# Patient Record
Sex: Female | Born: 1960
Health system: Southern US, Community
[De-identification: ages and names within clinical notes are randomized; demographics above are authoritative.]

## PROBLEM LIST (undated history)

## (undated) DIAGNOSIS — E785 Hyperlipidemia, unspecified: Secondary | ICD-10-CM

## (undated) DIAGNOSIS — E039 Hypothyroidism, unspecified: Secondary | ICD-10-CM

## (undated) DIAGNOSIS — K219 Gastro-esophageal reflux disease without esophagitis: Secondary | ICD-10-CM

## (undated) DIAGNOSIS — E559 Vitamin D deficiency, unspecified: Secondary | ICD-10-CM

## (undated) DIAGNOSIS — M1990 Unspecified osteoarthritis, unspecified site: Secondary | ICD-10-CM

## (undated) HISTORY — PX: COLONOSCOPY: SHX174

## (undated) HISTORY — PX: TUBAL LIGATION: SHX77

## (undated) HISTORY — DX: Vitamin D deficiency, unspecified: E55.9

---

## 1983-07-03 HISTORY — PX: TUBAL LIGATION: SHX77

## 2005-07-11 ENCOUNTER — Encounter: Admission: RE | Admit: 2005-07-11 | Discharge: 2005-07-11 | Payer: Self-pay | Admitting: General Surgery

## 2006-07-22 ENCOUNTER — Encounter: Admission: RE | Admit: 2006-07-22 | Discharge: 2006-07-22 | Payer: Self-pay | Admitting: Family Medicine

## 2016-12-19 DIAGNOSIS — R310 Gross hematuria: Secondary | ICD-10-CM | POA: Diagnosis not present

## 2016-12-19 DIAGNOSIS — R319 Hematuria, unspecified: Secondary | ICD-10-CM | POA: Diagnosis not present

## 2016-12-27 DIAGNOSIS — R319 Hematuria, unspecified: Secondary | ICD-10-CM | POA: Diagnosis not present

## 2017-03-01 DIAGNOSIS — Z01419 Encounter for gynecological examination (general) (routine) without abnormal findings: Secondary | ICD-10-CM | POA: Diagnosis not present

## 2017-03-01 DIAGNOSIS — Z6826 Body mass index (BMI) 26.0-26.9, adult: Secondary | ICD-10-CM | POA: Diagnosis not present

## 2017-03-11 DIAGNOSIS — Z1231 Encounter for screening mammogram for malignant neoplasm of breast: Secondary | ICD-10-CM | POA: Diagnosis not present

## 2017-03-11 DIAGNOSIS — Z13820 Encounter for screening for osteoporosis: Secondary | ICD-10-CM | POA: Diagnosis not present

## 2017-05-01 DIAGNOSIS — M8580 Other specified disorders of bone density and structure, unspecified site: Secondary | ICD-10-CM | POA: Diagnosis not present

## 2017-08-07 DIAGNOSIS — E559 Vitamin D deficiency, unspecified: Secondary | ICD-10-CM | POA: Diagnosis not present

## 2017-08-07 DIAGNOSIS — R799 Abnormal finding of blood chemistry, unspecified: Secondary | ICD-10-CM | POA: Diagnosis not present

## 2017-09-19 DIAGNOSIS — R946 Abnormal results of thyroid function studies: Secondary | ICD-10-CM | POA: Diagnosis not present

## 2017-11-12 DIAGNOSIS — E559 Vitamin D deficiency, unspecified: Secondary | ICD-10-CM | POA: Diagnosis not present

## 2017-12-03 DIAGNOSIS — R946 Abnormal results of thyroid function studies: Secondary | ICD-10-CM | POA: Diagnosis not present

## 2017-12-17 DIAGNOSIS — L578 Other skin changes due to chronic exposure to nonionizing radiation: Secondary | ICD-10-CM | POA: Diagnosis not present

## 2017-12-17 DIAGNOSIS — L821 Other seborrheic keratosis: Secondary | ICD-10-CM | POA: Diagnosis not present

## 2017-12-17 DIAGNOSIS — R58 Hemorrhage, not elsewhere classified: Secondary | ICD-10-CM | POA: Diagnosis not present

## 2017-12-17 DIAGNOSIS — L820 Inflamed seborrheic keratosis: Secondary | ICD-10-CM | POA: Diagnosis not present

## 2018-02-11 DIAGNOSIS — E559 Vitamin D deficiency, unspecified: Secondary | ICD-10-CM | POA: Diagnosis not present

## 2018-06-24 DIAGNOSIS — E559 Vitamin D deficiency, unspecified: Secondary | ICD-10-CM | POA: Diagnosis not present

## 2018-06-24 DIAGNOSIS — Z01419 Encounter for gynecological examination (general) (routine) without abnormal findings: Secondary | ICD-10-CM | POA: Diagnosis not present

## 2018-06-24 DIAGNOSIS — Z1231 Encounter for screening mammogram for malignant neoplasm of breast: Secondary | ICD-10-CM | POA: Diagnosis not present

## 2018-06-24 DIAGNOSIS — Z6826 Body mass index (BMI) 26.0-26.9, adult: Secondary | ICD-10-CM | POA: Diagnosis not present

## 2018-06-24 DIAGNOSIS — E039 Hypothyroidism, unspecified: Secondary | ICD-10-CM | POA: Diagnosis not present

## 2018-07-15 DIAGNOSIS — M25579 Pain in unspecified ankle and joints of unspecified foot: Secondary | ICD-10-CM | POA: Diagnosis not present

## 2018-07-15 DIAGNOSIS — M79671 Pain in right foot: Secondary | ICD-10-CM | POA: Diagnosis not present

## 2018-07-15 DIAGNOSIS — M25539 Pain in unspecified wrist: Secondary | ICD-10-CM | POA: Diagnosis not present

## 2018-07-15 DIAGNOSIS — M25572 Pain in left ankle and joints of left foot: Secondary | ICD-10-CM | POA: Diagnosis not present

## 2018-07-15 DIAGNOSIS — M25571 Pain in right ankle and joints of right foot: Secondary | ICD-10-CM | POA: Diagnosis not present

## 2018-07-15 DIAGNOSIS — M79672 Pain in left foot: Secondary | ICD-10-CM | POA: Diagnosis not present

## 2018-07-15 DIAGNOSIS — M064 Inflammatory polyarthropathy: Secondary | ICD-10-CM | POA: Diagnosis not present

## 2018-07-15 DIAGNOSIS — M79642 Pain in left hand: Secondary | ICD-10-CM | POA: Diagnosis not present

## 2018-07-15 DIAGNOSIS — M79643 Pain in unspecified hand: Secondary | ICD-10-CM | POA: Diagnosis not present

## 2018-07-15 DIAGNOSIS — M79641 Pain in right hand: Secondary | ICD-10-CM | POA: Diagnosis not present

## 2018-07-23 DIAGNOSIS — M25539 Pain in unspecified wrist: Secondary | ICD-10-CM | POA: Diagnosis not present

## 2018-07-23 DIAGNOSIS — M79671 Pain in right foot: Secondary | ICD-10-CM | POA: Diagnosis not present

## 2018-07-23 DIAGNOSIS — M79643 Pain in unspecified hand: Secondary | ICD-10-CM | POA: Diagnosis not present

## 2018-07-23 DIAGNOSIS — M25579 Pain in unspecified ankle and joints of unspecified foot: Secondary | ICD-10-CM | POA: Diagnosis not present

## 2018-08-21 DIAGNOSIS — G2581 Restless legs syndrome: Secondary | ICD-10-CM | POA: Diagnosis not present

## 2018-08-21 DIAGNOSIS — G478 Other sleep disorders: Secondary | ICD-10-CM | POA: Diagnosis not present

## 2018-08-21 DIAGNOSIS — E063 Autoimmune thyroiditis: Secondary | ICD-10-CM | POA: Diagnosis not present

## 2018-08-21 DIAGNOSIS — G4700 Insomnia, unspecified: Secondary | ICD-10-CM | POA: Diagnosis not present

## 2018-08-21 DIAGNOSIS — E039 Hypothyroidism, unspecified: Secondary | ICD-10-CM | POA: Diagnosis not present

## 2018-08-21 DIAGNOSIS — E559 Vitamin D deficiency, unspecified: Secondary | ICD-10-CM | POA: Diagnosis not present

## 2018-08-21 DIAGNOSIS — M791 Myalgia, unspecified site: Secondary | ICD-10-CM | POA: Diagnosis not present

## 2018-08-28 DIAGNOSIS — E038 Other specified hypothyroidism: Secondary | ICD-10-CM | POA: Diagnosis not present

## 2018-08-28 DIAGNOSIS — M8588 Other specified disorders of bone density and structure, other site: Secondary | ICD-10-CM | POA: Diagnosis not present

## 2018-08-28 DIAGNOSIS — R197 Diarrhea, unspecified: Secondary | ICD-10-CM | POA: Diagnosis not present

## 2018-08-28 DIAGNOSIS — E063 Autoimmune thyroiditis: Secondary | ICD-10-CM | POA: Diagnosis not present

## 2018-08-28 DIAGNOSIS — E559 Vitamin D deficiency, unspecified: Secondary | ICD-10-CM | POA: Diagnosis not present

## 2019-01-29 DIAGNOSIS — E038 Other specified hypothyroidism: Secondary | ICD-10-CM | POA: Diagnosis not present

## 2019-01-29 DIAGNOSIS — E7800 Pure hypercholesterolemia, unspecified: Secondary | ICD-10-CM | POA: Diagnosis not present

## 2019-01-29 DIAGNOSIS — G2581 Restless legs syndrome: Secondary | ICD-10-CM | POA: Diagnosis not present

## 2019-01-29 DIAGNOSIS — Z79899 Other long term (current) drug therapy: Secondary | ICD-10-CM | POA: Diagnosis not present

## 2019-02-02 DIAGNOSIS — E038 Other specified hypothyroidism: Secondary | ICD-10-CM | POA: Diagnosis not present

## 2019-02-02 DIAGNOSIS — Z79899 Other long term (current) drug therapy: Secondary | ICD-10-CM | POA: Diagnosis not present

## 2019-02-02 DIAGNOSIS — E7800 Pure hypercholesterolemia, unspecified: Secondary | ICD-10-CM | POA: Diagnosis not present

## 2019-03-17 DIAGNOSIS — Z13820 Encounter for screening for osteoporosis: Secondary | ICD-10-CM | POA: Diagnosis not present

## 2019-07-22 DIAGNOSIS — Z1231 Encounter for screening mammogram for malignant neoplasm of breast: Secondary | ICD-10-CM | POA: Diagnosis not present

## 2019-07-22 DIAGNOSIS — Z01419 Encounter for gynecological examination (general) (routine) without abnormal findings: Secondary | ICD-10-CM | POA: Diagnosis not present

## 2019-07-22 DIAGNOSIS — Z6828 Body mass index (BMI) 28.0-28.9, adult: Secondary | ICD-10-CM | POA: Diagnosis not present

## 2019-07-22 DIAGNOSIS — E559 Vitamin D deficiency, unspecified: Secondary | ICD-10-CM | POA: Diagnosis not present

## 2019-08-31 ENCOUNTER — Observation Stay (HOSPITAL_COMMUNITY)
Admission: EM | Admit: 2019-08-31 | Discharge: 2019-09-01 | Disposition: A | Discharge status: Home / Self Care | Payer: BC Managed Care – PPO | Attending: Surgery | Admitting: Surgery

## 2019-08-31 ENCOUNTER — Encounter (HOSPITAL_COMMUNITY): Payer: Self-pay | Admitting: Emergency Medicine

## 2019-08-31 ENCOUNTER — Other Ambulatory Visit: Payer: Self-pay

## 2019-08-31 DIAGNOSIS — R1011 Right upper quadrant pain: Secondary | ICD-10-CM | POA: Diagnosis not present

## 2019-08-31 DIAGNOSIS — K810 Acute cholecystitis: Secondary | ICD-10-CM | POA: Diagnosis not present

## 2019-08-31 DIAGNOSIS — Z03818 Encounter for observation for suspected exposure to other biological agents ruled out: Secondary | ICD-10-CM | POA: Diagnosis not present

## 2019-08-31 DIAGNOSIS — Z20822 Contact with and (suspected) exposure to covid-19: Secondary | ICD-10-CM | POA: Diagnosis not present

## 2019-08-31 DIAGNOSIS — K8010 Calculus of gallbladder with chronic cholecystitis without obstruction: Principal | ICD-10-CM | POA: Insufficient documentation

## 2019-08-31 DIAGNOSIS — E039 Hypothyroidism, unspecified: Secondary | ICD-10-CM | POA: Diagnosis not present

## 2019-08-31 DIAGNOSIS — E785 Hyperlipidemia, unspecified: Secondary | ICD-10-CM | POA: Diagnosis not present

## 2019-08-31 DIAGNOSIS — K819 Cholecystitis, unspecified: Secondary | ICD-10-CM | POA: Diagnosis present

## 2019-08-31 HISTORY — DX: Hyperlipidemia, unspecified: E78.5

## 2019-08-31 HISTORY — DX: Gastro-esophageal reflux disease without esophagitis: K21.9

## 2019-08-31 HISTORY — DX: Unspecified osteoarthritis, unspecified site: M19.90

## 2019-08-31 HISTORY — DX: Hypothyroidism, unspecified: E03.9

## 2019-08-31 LAB — COMPREHENSIVE METABOLIC PANEL
ALT: 30 U/L (ref 0–44)
AST: 25 U/L (ref 15–41)
Albumin: 4.4 g/dL (ref 3.5–5.0)
Alkaline Phosphatase: 68 U/L (ref 38–126)
Anion gap: 12 (ref 5–15)
BUN: 13 mg/dL (ref 6–20)
CO2: 23 mmol/L (ref 22–32)
Calcium: 9.4 mg/dL (ref 8.9–10.3)
Chloride: 104 mmol/L (ref 98–111)
Creatinine, Ser: 0.77 mg/dL (ref 0.44–1.00)
GFR calc Af Amer: >60 mL/min (ref >60)
GFR calc non Af Amer: >60 mL/min (ref >60)
Glucose, Bld: 105 mg/dL — ABNORMAL HIGH (ref 70–99)
Potassium: 3.7 mmol/L (ref 3.5–5.1)
Sodium: 139 mmol/L (ref 135–145)
Total Bilirubin: 0.7 mg/dL (ref 0.3–1.2)
Total Protein: 7.6 g/dL (ref 6.5–8.1)

## 2019-08-31 LAB — CBC
HCT: 42.3 % (ref 36.0–46.0)
Hemoglobin: 13.8 g/dL (ref 12.0–15.0)
MCH: 29.1 pg (ref 26.0–34.0)
MCHC: 32.6 g/dL (ref 30.0–36.0)
MCV: 89.1 fL (ref 80.0–100.0)
Platelets: 291 10*3/uL (ref 150–400)
RBC: 4.75 MIL/uL (ref 3.87–5.11)
RDW: 12 % (ref 11.5–15.5)
WBC: 8.4 10*3/uL (ref 4.0–10.5)
nRBC: 0 % (ref 0.0–0.2)

## 2019-08-31 LAB — URINALYSIS, ROUTINE W REFLEX MICROSCOPIC
Bacteria, UA: NONE SEEN
Bilirubin Urine: NEGATIVE
Glucose, UA: NEGATIVE mg/dL
Ketones, ur: NEGATIVE mg/dL
Nitrite: NEGATIVE
Protein, ur: NEGATIVE mg/dL
Specific Gravity, Urine: 1.021 (ref 1.005–1.030)
pH: 6 (ref 5.0–8.0)

## 2019-08-31 LAB — LIPASE, BLOOD: Lipase: 25 U/L (ref 11–51)

## 2019-08-31 MED ORDER — SODIUM CHLORIDE 0.9% FLUSH: 3.0000 mL | Freq: Once | INTRAVENOUS | Status: DC

## 2019-08-31 MED ORDER — ONDANSETRON HCL 4 MG/2ML IJ SOLN
4.0000 mg | Freq: Once | INTRAMUSCULAR | Status: AC
  Administered 2019-09-01: 4 mg via INTRAVENOUS
  Filled 2019-08-31: qty 2

## 2019-08-31 MED ORDER — HYDROMORPHONE HCL 1 MG/ML IJ SOLN
0.5000 mg | Freq: Once | INTRAMUSCULAR | Status: AC
  Administered 2019-09-01: 0.5 mg via INTRAVENOUS
  Filled 2019-08-31: qty 1

## 2019-08-31 NOTE — ED Triage Notes (Signed)
Pt arrives to ER after a sudden onset of RUQ pain that began right after eating at 12pm. Pt states she has been told she has stones in the past but never had this type of pain.

## 2019-08-31 NOTE — ED Provider Notes (Signed)
[facility] EMERGENCY DEPARTMENT Provider Note   CSN: [id number] Arrival date & time: 08/31/19  1600     History Chief Complaint  Patient presents with  . Abdominal Pain    Joyce Allison is a 59 y.o. female.  Patient with history of HLD, thyroid disease presents with RUQ abdominal pain that started around noon today after eating lunch which included meatloaf and mashed potatoes. She has had nausea and pain throughout the day with infrequent vomiting. She reports multiple loose, nonbloody stools today as well. No fever, chest pain, SOB or cough. No urinary symptoms. She states that taking a deep breath makes her abdominal pain worse. She has not been able to eat or drink anything else today as any attempt has exacerbated her pain.  The history is provided by the patient. No language interpreter was used.  Abdominal Pain Associated symptoms: diarrhea, nausea and vomiting   Associated symptoms: no chills and no fever        History reviewed. No pertinent past medical history.  There are no problems to display for this patient.  OB History   No obstetric history on file.     No family history on file.  Social History   Tobacco Use  . Smoking status: Not on file  Substance Use Topics  . Alcohol use: Not on file  . Drug use: Not on file    Home Medications Prior to Admission medications   Not on File    Allergies    Motrin [ibuprofen]  Review of Systems   Review of Systems  Constitutional: Negative for chills and fever.  HENT: Negative.   Respiratory: Negative.   Cardiovascular: Negative.   Gastrointestinal: Positive for abdominal pain, diarrhea, nausea and vomiting. Negative for blood in stool.  Musculoskeletal: Negative.  Negative for back pain.  Skin: Negative.   Neurological: Negative.     Physical Exam Updated Vital Signs BP (!) 146/88 (BP Location: Left Arm)   Pulse 81   Temp 98.1 F (36.7 C) (Oral)   Resp 16   SpO2 98%    Physical Exam Vitals and nursing note reviewed.  Constitutional:      Appearance: She is well-developed. She is obese.  HENT:     Head: Normocephalic.  Cardiovascular:     Rate and Rhythm: Normal rate and regular rhythm.  Pulmonary:     Effort: Pulmonary effort is normal.     Breath sounds: Normal breath sounds.  Abdominal:     General: Bowel sounds are decreased.     Palpations: Abdomen is soft.     Tenderness: There is abdominal tenderness in the right upper quadrant and epigastric area. There is guarding. There is no rebound.  Musculoskeletal:        General: Normal range of motion.     Cervical back: Normal range of motion and neck supple.  Skin:    General: Skin is warm and dry.     Findings: No rash.  Neurological:     Mental Status: She is alert.     Cranial Nerves: No cranial nerve deficit.     ED Results / Procedures / Treatments   Labs (all labs ordered are listed, but only abnormal results are displayed) Labs Reviewed  COMPREHENSIVE METABOLIC PANEL - Abnormal; Notable for the following components:      Result Value   Glucose, Bld 105 (*)    All other components within normal limits  URINALYSIS, ROUTINE W REFLEX MICROSCOPIC - Abnormal; Notable  for the following components:   Hgb urine dipstick SMALL (*)    Leukocytes,Ua MODERATE (*)    All other components within normal limits  LIPASE, BLOOD  CBC    EKG None  Radiology No results found.  Procedures Procedures (including critical care time)  Medications Ordered in ED Medications  sodium chloride flush (NS) 0.9 % injection 3 mL (has no administration in time range)  HYDROmorphone (DILAUDID) injection 0.5 mg (has no administration in time range)  ondansetron (ZOFRAN) injection 4 mg (has no administration in time range)    ED Course  I have reviewed the triage vital signs and the nursing notes.  Pertinent labs & imaging results that were available during my care of the patient were reviewed by  me and considered in my medical decision making (see chart for details).    MDM Rules/Calculators/A&P                      Patient to ED with abdominal pain in the RUQ since earlier today.   She is uncomfortable appearing but nontoxic. Tender RUQ, concerning for cholecystitis/cholelithiasis. Pain and nausea medication ordered.   On re-evaluation, pain is improved, nausea resolved. [place] pending. Labs essentially unremarkable.   1:30 - US findings c/w acute cholecystitis. On re-eval, she remain tender in the RUQ. Consult surgery.   Discussed patient's condition with Dr. Derrell Lolling who will see the patient in anticipation of acute cholecystectomy. COVID collected. Rocephin ordered per Dr. Jacinto Halim recommendation. The patient has been updated on plan of admission and surgery.  Final Clinical Impression(s) / ED Diagnoses Final diagnoses:  None   1. Acute cholecystitis  Rx / DC Orders ED Discharge Orders    None       Danne Harbor 09/01/19 0209    Palumbo, April, MD 09/01/19 0272

## 2019-09-01 ENCOUNTER — Emergency Department (HOSPITAL_COMMUNITY): Payer: BC Managed Care – PPO

## 2019-09-01 ENCOUNTER — Encounter (HOSPITAL_COMMUNITY): Payer: Self-pay

## 2019-09-01 ENCOUNTER — Other Ambulatory Visit: Payer: Self-pay

## 2019-09-01 ENCOUNTER — Observation Stay (HOSPITAL_COMMUNITY): Payer: BC Managed Care – PPO | Admitting: Certified Registered Nurse Anesthetist

## 2019-09-01 ENCOUNTER — Encounter (HOSPITAL_COMMUNITY)
Admission: EM | Disposition: A | Discharge status: Home / Self Care | Payer: Self-pay | Source: Home / Self Care | Attending: Emergency Medicine

## 2019-09-01 DIAGNOSIS — E039 Hypothyroidism, unspecified: Secondary | ICD-10-CM | POA: Diagnosis not present

## 2019-09-01 DIAGNOSIS — E785 Hyperlipidemia, unspecified: Secondary | ICD-10-CM | POA: Diagnosis not present

## 2019-09-01 DIAGNOSIS — K819 Cholecystitis, unspecified: Secondary | ICD-10-CM | POA: Diagnosis present

## 2019-09-01 DIAGNOSIS — K810 Acute cholecystitis: Secondary | ICD-10-CM | POA: Diagnosis not present

## 2019-09-01 DIAGNOSIS — K8020 Calculus of gallbladder without cholecystitis without obstruction: Secondary | ICD-10-CM | POA: Diagnosis not present

## 2019-09-01 DIAGNOSIS — K8010 Calculus of gallbladder with chronic cholecystitis without obstruction: Secondary | ICD-10-CM | POA: Diagnosis not present

## 2019-09-01 DIAGNOSIS — K219 Gastro-esophageal reflux disease without esophagitis: Secondary | ICD-10-CM | POA: Diagnosis not present

## 2019-09-01 HISTORY — PX: CHOLECYSTECTOMY: SHX55

## 2019-09-01 LAB — RESPIRATORY PANEL BY RT PCR (FLU A&B, COVID)
Influenza A by PCR: NEGATIVE
Influenza B by PCR: NEGATIVE
SARS Coronavirus 2 by RT PCR: NEGATIVE

## 2019-09-01 LAB — SURGICAL PCR SCREEN
MRSA, PCR: NEGATIVE
Staphylococcus aureus: POSITIVE — AB

## 2019-09-01 LAB — HIV ANTIBODY (ROUTINE TESTING W REFLEX): HIV Screen 4th Generation wRfx: NONREACTIVE

## 2019-09-01 SURGERY — LAPAROSCOPIC CHOLECYSTECTOMY WITH INTRAOPERATIVE CHOLANGIOGRAM
Anesthesia: General | Site: Abdomen

## 2019-09-01 MED ORDER — GLYCOPYRROLATE PF 0.2 MG/ML IJ SOSY
PREFILLED_SYRINGE | INTRAMUSCULAR | Status: DC | PRN
  Administered 2019-09-01: .2 mg via INTRAVENOUS

## 2019-09-01 MED ORDER — DIPHENHYDRAMINE HCL 12.5 MG/5ML PO ELIX: 12.5000 mg | ORAL_SOLUTION | Freq: Four times a day (QID) | ORAL | Status: DC | PRN

## 2019-09-01 MED ORDER — BUPIVACAINE HCL (PF) 0.25 % IJ SOLN
INTRAMUSCULAR | Status: AC
  Filled 2019-09-01: qty 30

## 2019-09-01 MED ORDER — POTASSIUM CHLORIDE 2 MEQ/ML IV SOLN
INTRAVENOUS | Status: DC
  Filled 2019-09-01 (×3): qty 1000

## 2019-09-01 MED ORDER — KCL IN DEXTROSE-NACL 40-5-0.9 MEQ/L-%-% IV SOLN
INTRAVENOUS | Status: DC
  Filled 2019-09-01: qty 1000

## 2019-09-01 MED ORDER — ENOXAPARIN SODIUM 40 MG/0.4ML ~~LOC~~ SOLN: 40.0000 mg | SUBCUTANEOUS | Status: DC

## 2019-09-01 MED ORDER — HYDROMORPHONE HCL 1 MG/ML IJ SOLN: 1.0000 mg | INTRAMUSCULAR | Status: DC | PRN

## 2019-09-01 MED ORDER — ACETAMINOPHEN 325 MG PO TABS: 325.0000 mg | ORAL_TABLET | Freq: Once | ORAL | Status: DC | PRN

## 2019-09-01 MED ORDER — ACETAMINOPHEN 10 MG/ML IV SOLN: 1000.0000 mg | Freq: Once | INTRAVENOUS | Status: DC | PRN

## 2019-09-01 MED ORDER — MEPERIDINE HCL 25 MG/ML IJ SOLN: 6.2500 mg | INTRAMUSCULAR | Status: DC | PRN

## 2019-09-01 MED ORDER — MIDAZOLAM HCL 2 MG/2ML IJ SOLN
INTRAMUSCULAR | Status: AC
  Filled 2019-09-01: qty 2

## 2019-09-01 MED ORDER — SODIUM CHLORIDE 0.9 % IR SOLN
Status: DC | PRN
  Administered 2019-09-01: 1

## 2019-09-01 MED ORDER — MIDAZOLAM HCL 2 MG/2ML IJ SOLN
INTRAMUSCULAR | Status: DC | PRN
  Administered 2019-09-01: 2 mg via INTRAVENOUS

## 2019-09-01 MED ORDER — ROCURONIUM BROMIDE 10 MG/ML (PF) SYRINGE
PREFILLED_SYRINGE | INTRAVENOUS | Status: DC | PRN
  Administered 2019-09-01: 30 mg via INTRAVENOUS

## 2019-09-01 MED ORDER — GLYCOPYRROLATE PF 0.2 MG/ML IJ SOSY
PREFILLED_SYRINGE | INTRAMUSCULAR | Status: AC
  Filled 2019-09-01: qty 1

## 2019-09-01 MED ORDER — SUCCINYLCHOLINE CHLORIDE 200 MG/10ML IV SOSY
PREFILLED_SYRINGE | INTRAVENOUS | Status: AC
  Filled 2019-09-01: qty 10

## 2019-09-01 MED ORDER — ONDANSETRON HCL 4 MG/2ML IJ SOLN
INTRAMUSCULAR | Status: DC | PRN
  Administered 2019-09-01: 4 mg via INTRAVENOUS

## 2019-09-01 MED ORDER — ONDANSETRON 4 MG PO TBDP: 4.0000 mg | ORAL_TABLET | Freq: Four times a day (QID) | ORAL | Status: DC | PRN

## 2019-09-01 MED ORDER — SUGAMMADEX SODIUM 200 MG/2ML IV SOLN
INTRAVENOUS | Status: DC | PRN
  Administered 2019-09-01: 200 mg via INTRAVENOUS

## 2019-09-01 MED ORDER — BUPIVACAINE HCL 0.25 % IJ SOLN
INTRAMUSCULAR | Status: DC | PRN
  Administered 2019-09-01: 25 mL

## 2019-09-01 MED ORDER — ROCURONIUM BROMIDE 10 MG/ML (PF) SYRINGE
PREFILLED_SYRINGE | INTRAVENOUS | Status: AC
  Filled 2019-09-01: qty 10

## 2019-09-01 MED ORDER — SODIUM CHLORIDE 0.9 % IV SOLN
1.0000 g | Freq: Once | INTRAVENOUS | Status: AC
  Administered 2019-09-01: 1 g via INTRAVENOUS
  Filled 2019-09-01: qty 10

## 2019-09-01 MED ORDER — DEXAMETHASONE SODIUM PHOSPHATE 10 MG/ML IJ SOLN
INTRAMUSCULAR | Status: DC | PRN
  Administered 2019-09-01: 5 mg via INTRAVENOUS

## 2019-09-01 MED ORDER — PHENYLEPHRINE 40 MCG/ML (10ML) SYRINGE FOR IV PUSH (FOR BLOOD PRESSURE SUPPORT)
PREFILLED_SYRINGE | INTRAVENOUS | Status: DC | PRN
  Administered 2019-09-01: 80 ug via INTRAVENOUS

## 2019-09-01 MED ORDER — LACTATED RINGERS IV SOLN: INTRAVENOUS | Status: DC

## 2019-09-01 MED ORDER — ONDANSETRON HCL 4 MG/2ML IJ SOLN
INTRAMUSCULAR | Status: AC
  Filled 2019-09-01: qty 2

## 2019-09-01 MED ORDER — ALUM & MAG HYDROXIDE-SIMETH 200-200-20 MG/5ML PO SUSP: 30.0000 mL | Freq: Four times a day (QID) | ORAL | Status: DC | PRN

## 2019-09-01 MED ORDER — PROPOFOL 10 MG/ML IV BOLUS
INTRAVENOUS | Status: DC | PRN
  Administered 2019-09-01: 140 mg via INTRAVENOUS

## 2019-09-01 MED ORDER — ACETAMINOPHEN 325 MG PO TABS: 650.0000 mg | ORAL_TABLET | Freq: Four times a day (QID) | ORAL | Status: DC | PRN

## 2019-09-01 MED ORDER — ACETAMINOPHEN 160 MG/5ML PO SOLN: 325.0000 mg | Freq: Once | ORAL | Status: DC | PRN

## 2019-09-01 MED ORDER — DIPHENHYDRAMINE HCL 50 MG/ML IJ SOLN
INTRAMUSCULAR | Status: AC
  Filled 2019-09-01: qty 1

## 2019-09-01 MED ORDER — LACTATED RINGERS IV SOLN: INTRAVENOUS | Status: DC | PRN

## 2019-09-01 MED ORDER — ENSURE SURGERY PO LIQD
237.0000 mL | Freq: Two times a day (BID) | ORAL | Status: DC
  Administered 2019-09-01: 237 mL via ORAL
  Filled 2019-09-01 (×3): qty 237

## 2019-09-01 MED ORDER — 0.9 % SODIUM CHLORIDE (POUR BTL) OPTIME
TOPICAL | Status: DC | PRN
  Administered 2019-09-01: 1000 mL

## 2019-09-01 MED ORDER — FENTANYL CITRATE (PF) 250 MCG/5ML IJ SOLN
INTRAMUSCULAR | Status: AC
  Filled 2019-09-01: qty 5

## 2019-09-01 MED ORDER — OXYCODONE HCL 5 MG PO TABS: 5.0000 mg | ORAL_TABLET | ORAL | Status: DC | PRN

## 2019-09-01 MED ORDER — LIDOCAINE 2% (20 MG/ML) 5 ML SYRINGE
INTRAMUSCULAR | Status: DC | PRN
  Administered 2019-09-01: 60 mg via INTRAVENOUS

## 2019-09-01 MED ORDER — FENTANYL CITRATE (PF) 250 MCG/5ML IJ SOLN
INTRAMUSCULAR | Status: DC | PRN
  Administered 2019-09-01 (×2): 50 ug via INTRAVENOUS
  Administered 2019-09-01: 100 ug via INTRAVENOUS

## 2019-09-01 MED ORDER — PROMETHAZINE HCL 25 MG/ML IJ SOLN: 6.2500 mg | INTRAMUSCULAR | Status: DC | PRN

## 2019-09-01 MED ORDER — DEXAMETHASONE SODIUM PHOSPHATE 10 MG/ML IJ SOLN
INTRAMUSCULAR | Status: AC
  Filled 2019-09-01: qty 1

## 2019-09-01 MED ORDER — ACETAMINOPHEN 325 MG PO TABS
650.0000 mg | ORAL_TABLET | Freq: Four times a day (QID) | ORAL | Status: DC
  Administered 2019-09-01: 12:00:00 650 mg via ORAL
  Filled 2019-09-01 (×2): qty 2

## 2019-09-01 MED ORDER — PHENYLEPHRINE 40 MCG/ML (10ML) SYRINGE FOR IV PUSH (FOR BLOOD PRESSURE SUPPORT)
PREFILLED_SYRINGE | INTRAVENOUS | Status: AC
  Filled 2019-09-01: qty 10

## 2019-09-01 MED ORDER — LIDOCAINE 2% (20 MG/ML) 5 ML SYRINGE
INTRAMUSCULAR | Status: AC
  Filled 2019-09-01: qty 5

## 2019-09-01 MED ORDER — ATORVASTATIN CALCIUM 10 MG PO TABS: 20.0000 mg | ORAL_TABLET | Freq: Every day | ORAL | Status: DC

## 2019-09-01 MED ORDER — PROPOFOL 10 MG/ML IV BOLUS
INTRAVENOUS | Status: AC
  Filled 2019-09-01: qty 20

## 2019-09-01 MED ORDER — IBUPROFEN 800 MG PO TABS: 800.0000 mg | ORAL_TABLET | Freq: Three times a day (TID) | ORAL | 0 refills | Status: DC | PRN

## 2019-09-01 MED ORDER — SUCCINYLCHOLINE CHLORIDE 200 MG/10ML IV SOSY
PREFILLED_SYRINGE | INTRAVENOUS | Status: DC | PRN
  Administered 2019-09-01: 140 mg via INTRAVENOUS

## 2019-09-01 MED ORDER — VITAMIN D 25 MCG (1000 UNIT) PO TABS
1000.0000 [IU] | ORAL_TABLET | Freq: Every day | ORAL | Status: DC
  Administered 2019-09-01: 12:00:00 1000 [IU] via ORAL
  Filled 2019-09-01: qty 1

## 2019-09-01 MED ORDER — ONDANSETRON HCL 4 MG/2ML IJ SOLN: 4.0000 mg | Freq: Four times a day (QID) | INTRAMUSCULAR | Status: DC | PRN

## 2019-09-01 MED ORDER — HYDROMORPHONE HCL 1 MG/ML IJ SOLN
0.2500 mg | INTRAMUSCULAR | Status: DC | PRN
  Administered 2019-09-01 (×2): 0.5 mg via INTRAVENOUS

## 2019-09-01 MED ORDER — ESMOLOL HCL 100 MG/10ML IV SOLN
INTRAVENOUS | Status: AC
  Filled 2019-09-01: qty 10

## 2019-09-01 MED ORDER — LEVOTHYROXINE SODIUM 75 MCG PO TABS: 75.0000 ug | ORAL_TABLET | Freq: Every morning | ORAL | Status: DC

## 2019-09-01 MED ORDER — DIPHENHYDRAMINE HCL 50 MG/ML IJ SOLN
INTRAMUSCULAR | Status: DC | PRN
  Administered 2019-09-01: 12.5 mg via INTRAVENOUS

## 2019-09-01 MED ORDER — HYDROMORPHONE HCL 1 MG/ML IJ SOLN
INTRAMUSCULAR | Status: AC
  Filled 2019-09-01: qty 1

## 2019-09-01 MED ORDER — DIPHENHYDRAMINE HCL 50 MG/ML IJ SOLN: 12.5000 mg | Freq: Four times a day (QID) | INTRAMUSCULAR | Status: DC | PRN

## 2019-09-01 MED ORDER — ESMOLOL HCL 100 MG/10ML IV SOLN
INTRAVENOUS | Status: DC | PRN
  Administered 2019-09-01 (×2): 20 mg via INTRAVENOUS

## 2019-09-01 MED ORDER — OXYCODONE HCL 5 MG PO TABS: 5.0000 mg | ORAL_TABLET | Freq: Four times a day (QID) | ORAL | 0 refills | Status: DC | PRN

## 2019-09-01 SURGICAL SUPPLY — 38 items
APPLIER CLIP ROT 10 11.4 M/L (STAPLE) ×3
BLADE CLIPPER SURG (BLADE) IMPLANT
CANISTER SUCT 3000ML PPV (MISCELLANEOUS) ×3 IMPLANT
CHLORAPREP W/TINT 26 (MISCELLANEOUS) ×3 IMPLANT
CLIP APPLIE ROT 10 11.4 M/L (STAPLE) ×1 IMPLANT
COVER MAYO STAND STRL (DRAPES) ×3 IMPLANT
COVER SURGICAL LIGHT HANDLE (MISCELLANEOUS) ×3 IMPLANT
DERMABOND ADVANCED (GAUZE/BANDAGES/DRESSINGS) ×2
DERMABOND ADVANCED .7 DNX12 (GAUZE/BANDAGES/DRESSINGS) IMPLANT
DISSECTOR BLUNT TIP ENDO 5MM (MISCELLANEOUS) ×3 IMPLANT
DRAPE C-ARM 42X120 X-RAY (DRAPES) ×3 IMPLANT
ELECT REM PT RETURN 9FT ADLT (ELECTROSURGICAL) ×3
ELECTRODE REM PT RTRN 9FT ADLT (ELECTROSURGICAL) ×1 IMPLANT
GLOVE BIO SURGEON STRL SZ 6.5 (GLOVE) ×2 IMPLANT
GLOVE BIO SURGEONS STRL SZ 6.5 (GLOVE) ×1
GLOVE BIOGEL PI IND STRL 6 (GLOVE) ×1 IMPLANT
GLOVE BIOGEL PI INDICATOR 6 (GLOVE) ×2
GOWN STRL REUS W/ TWL LRG LVL3 (GOWN DISPOSABLE) ×3 IMPLANT
GOWN STRL REUS W/TWL LRG LVL3 (GOWN DISPOSABLE) ×9
KIT BASIN OR (CUSTOM PROCEDURE TRAY) ×3 IMPLANT
KIT TURNOVER KIT B (KITS) ×3 IMPLANT
NS IRRIG 1000ML POUR BTL (IV SOLUTION) ×3 IMPLANT
PAD ARMBOARD 7.5X6 YLW CONV (MISCELLANEOUS) ×3 IMPLANT
POUCH SPECIMEN RETRIEVAL 10MM (ENDOMECHANICALS) IMPLANT
SCISSORS LAP 5X35 DISP (ENDOMECHANICALS) ×3 IMPLANT
SET CHOLANGIOGRAPH 5 50 .035 (SET/KITS/TRAYS/PACK) ×3 IMPLANT
SET IRRIG TUBING LAPAROSCOPIC (IRRIGATION / IRRIGATOR) ×3 IMPLANT
SET TUBE SMOKE EVAC HIGH FLOW (TUBING) ×3 IMPLANT
SLEEVE ENDOPATH XCEL 5M (ENDOMECHANICALS) ×6 IMPLANT
SPECIMEN JAR SMALL (MISCELLANEOUS) ×3 IMPLANT
SUT MNCRL AB 4-0 PS2 18 (SUTURE) ×3 IMPLANT
SUT VICRYL 0 UR6 27IN ABS (SUTURE) IMPLANT
TOWEL GREEN STERILE (TOWEL DISPOSABLE) ×3 IMPLANT
TOWEL GREEN STERILE FF (TOWEL DISPOSABLE) ×3 IMPLANT
TRAY LAPAROSCOPIC MC (CUSTOM PROCEDURE TRAY) ×3 IMPLANT
TROCAR XCEL BLUNT TIP 100MML (ENDOMECHANICALS) ×3 IMPLANT
TROCAR XCEL NON-BLD 5MMX100MML (ENDOMECHANICALS) ×3 IMPLANT
WATER STERILE IRR 1000ML POUR (IV SOLUTION) ×3 IMPLANT

## 2019-09-01 NOTE — Progress Notes (Signed)
Patient discharged home today,husband driving her home. Personal belongings,discharged instructions,given . Advised to pick up medications called in to pharmacy of choice.Verbalized understanding of instructions

## 2019-09-01 NOTE — Discharge Summary (Signed)
[facility] Discharge Summary   Patient ID: Joyce Allison MRN: [id number] DOB/AGE: [birth year] 61 y.o.  Admit date: 08/31/2019 Discharge date: 09/01/2019  Admitting Diagnosis: Acute cholecystitis  Discharge Diagnosis Patient Active Problem List   Diagnosis Date Noted  . Cholecystitis 09/01/2019    Consultants None  Imaging: US Abdomen Limited  Result Date: 09/01/2019 CLINICAL DATA:  Initial evaluation for acute right upper quadrant pain. EXAM: ULTRASOUND ABDOMEN LIMITED RIGHT UPPER QUADRANT COMPARISON:  None available. FINDINGS: Gallbladder: Echogenic stones seen within the gallbladder lumen, with dominant 1.9 cm stone seen lodged at the gallbladder neck. Gallbladder wall thickened up to 7.5 mm. Trace free pericholecystic fluid. No sonographic Murphy sign indicated by sonographer. Common bile duct: Diameter: Up to 5.3 mm. Liver: No focal lesion identified. Within normal limits in parenchymal echogenicity. Portal vein is patent on color Doppler imaging with normal direction of blood flow towards the liver. Other: None. IMPRESSION: 1. Cholelithiasis with associated gallbladder wall thickening and trace free pericholecystic fluid. Clinical correlation for possible acute cholecystitis recommended. 2. No biliary dilatation. Electronically Signed   By: Rise Mu M.D.   On: 09/01/2019 01:13    Procedures Dr. Kris Mouton (09/01/19) - Laparoscopic Cholecystectomy  Hospital Course:  Patient is a 59 year old female who presented to [facility] with abdominal pain.  Workup showed acute cholecystitis.  Patient was admitted and underwent procedure listed above.  Tolerated procedure well and was transferred to the floor.  Diet was advanced as tolerated.  On POD#0, the patient was voiding well, tolerating diet, ambulating well, pain well controlled, vital signs stable, incisions c/d/i and felt stable for discharge home.  Patient will follow up in our office in 3-4 weeks and knows to call  with questions or concerns.  She will call to confirm appointment date/time.     Allergies as of 09/01/2019      Reactions   Motrin [ibuprofen] Anaphylaxis      Medication List    TAKE these medications   acetaminophen 325 MG tablet Commonly known as: TYLENOL Take 2 tablets (650 mg total) by mouth every 6 (six) hours as needed for mild pain.   atorvastatin 20 MG tablet Commonly known as: LIPITOR Take 20 mg by mouth at bedtime.   cholecalciferol 25 MCG (1000 UNIT) tablet Commonly known as: VITAMIN D3 Take 1,000 Units by mouth daily.   ibuprofen 800 MG tablet Commonly known as: ADVIL Take 1 tablet (800 mg total) by mouth every 8 (eight) hours as needed.   levothyroxine 75 MCG tablet Commonly known as: SYNTHROID Take 75 mcg by mouth every morning.   oxyCODONE 5 MG immediate release tablet Commonly known as: Roxicodone Take 1 tablet (5 mg total) by mouth every 6 (six) hours as needed for severe pain. Between doses, use tylenol and ibuprofen        Follow-up Information    [facility], [facility]. Go on 09/22/2019.   Specialty: General Surgery Why: Follow up appointment scheduled for 9:00 AM. Please arrive 30 min prior to appointment time. Bring photo ID and insurance information.  Contact information: [address] [place] [address] [phone]           Signed: Wells Guiles, [facility] 09/01/2019, 1:56 PM Please see Amion for pager number during day hours 7:00am-4:30pm

## 2019-09-01 NOTE — H&P (Signed)
CC: Abdominal pain  Requesting provider: Dr. Daun Peacock  HPI: Joyce Allison is an 59 y.o. female who is here for abdominal pain that began yesterday.  She states that she has had right upper quadrant abdominal pain episodes on several occasions for 2 to 3 months.  She states that this time after lunch she began with right upper quadrant abdominal pain that radiates to the back.  She states this was associated with nausea vomiting and diarrhea.  States this did not improve.  Secondary to continued pain she came to the ER for further evaluation and management. Patient underwent ultrasound which revealed 1.7 cm gallstone within the neck of the gallbladder.  LFTs were within normal limits.  I did review the studies personally.  Past medical history Patient with a history of hypothyroidism and hyperlipidemia.  Past surgical history Patient denies any previous surgeries.  No family history on file.  Social:  has no history on file for tobacco, alcohol, and drug.  Allergies:  Allergies  Allergen Reactions  . Motrin [Ibuprofen] Anaphylaxis    Medications: I have reviewed the patient's current medications.  Results for orders placed or performed during the hospital encounter of 08/31/19 (from the past 48 hour(s))  Lipase, blood     Status: None   Collection Time: 08/31/19  4:29 PM  Result Value Ref Range   Lipase 25 11 - 51 U/L    Comment: Performed at [facility], [address], [place], [place] [address]  Comprehensive metabolic panel     Status: Abnormal   Collection Time: 08/31/19  4:29 PM  Result Value Ref Range   Sodium 139 135 - 145 mmol/L   Potassium 3.7 3.5 - 5.1 mmol/L   Chloride 104 98 - 111 mmol/L   CO2 23 22 - 32 mmol/L   Glucose, Bld 105 (H) 70 - 99 mg/dL    Comment: Glucose reference range applies only to samples taken after fasting for at least 8 hours.   BUN 13 6 - 20 mg/dL   Creatinine, Ser 5.45 0.44 - 1.00 mg/dL   Calcium 9.4 8.9 - 62.5 mg/dL   Total  Protein 7.6 6.5 - 8.1 g/dL   Albumin 4.4 3.5 - 5.0 g/dL   AST 25 15 - 41 U/L   ALT 30 0 - 44 U/L   Alkaline Phosphatase 68 38 - 126 U/L   Total Bilirubin 0.7 0.3 - 1.2 mg/dL   GFR calc non Af Amer >60 >60 mL/min   GFR calc Af Amer >60 >60 mL/min   Anion gap 12 5 - 15    Comment: Performed at [facility], [address], [place], [place] [address]  CBC     Status: None   Collection Time: 08/31/19  4:29 PM  Result Value Ref Range   WBC 8.4 4.0 - 10.5 K/uL   RBC 4.75 3.87 - 5.11 MIL/uL   Hemoglobin 13.8 12.0 - 15.0 g/dL   HCT 73.4 28.7 - 68.1 %   MCV 89.1 80.0 - 100.0 fL   MCH 29.1 26.0 - 34.0 pg   MCHC 32.6 30.0 - 36.0 g/dL   RDW 15.7 26.2 - 03.5 %   Platelets 291 150 - 400 K/uL   nRBC 0.0 0.0 - 0.2 %    Comment: Performed at [facility], [address], [place], [place] [address]  Urinalysis, Routine w reflex microscopic     Status: Abnormal   Collection Time: 08/31/19  8:52 PM  Result Value Ref Range   Color, Urine YELLOW YELLOW   APPearance CLEAR CLEAR   Specific Gravity, Urine 1.021 1.005 - 1.030   pH 6.0 5.0 - 8.0   Glucose, UA NEGATIVE NEGATIVE mg/dL   Hgb urine dipstick SMALL (A) NEGATIVE   Bilirubin Urine NEGATIVE NEGATIVE   Ketones, ur NEGATIVE NEGATIVE mg/dL   Protein, ur NEGATIVE NEGATIVE mg/dL   Nitrite NEGATIVE NEGATIVE   Leukocytes,Ua MODERATE (A) NEGATIVE   RBC / HPF 6-10 0 - 5 RBC/hpf   WBC, UA 21-50 0 - 5 WBC/hpf   Bacteria, UA NONE SEEN NONE SEEN   Squamous Epithelial / LPF 0-5 0 - 5   Mucus PRESENT     Comment: Performed at [facility], [address], [place], [place] [address]  Respiratory Panel by RT PCR (Flu A&B, Covid) - Nasopharyngeal Swab     Status: None   Collection Time: 09/01/19  1:50 AM   Specimen: Nasopharyngeal Swab  Result Value Ref Range   SARS Coronavirus 2 by RT PCR NEGATIVE NEGATIVE    Comment: (NOTE) SARS-CoV-2 target nucleic acids are NOT DETECTED. The SARS-CoV-2 RNA is generally detectable in upper  respiratoy specimens during the acute phase of infection. The lowest concentration of SARS-CoV-2 viral copies this assay can detect is 131 copies/mL. A negative result does not preclude SARS-Cov-2 infection and should not be used as the sole basis for treatment or other patient management decisions. A negative result may occur with  improper specimen collection/handling, submission of specimen other than nasopharyngeal swab, presence of viral mutation(s) within the areas targeted by this assay, and inadequate number of viral copies (<131 copies/mL). A negative result must be combined with clinical observations, patient history, and epidemiological information. The expected result is Negative. Fact Sheet for Patients:  https://www.moore.com/ Fact Sheet for Healthcare Providers:  https://www.young.biz/ This test is not yet ap proved or cleared by the [place] FDA and  has been authorized for detection and/or diagnosis of SARS-CoV-2 by FDA under an Emergency Use Authorization (EUA). This EUA will remain  in effect (meaning this test can be used) for the duration of the COVID-19 declaration under Section 564(b)(1) of the Act, 21 U.S.C. section 360bbb-3(b)(1), unless the authorization is terminated or revoked sooner.    Influenza A by PCR NEGATIVE NEGATIVE   Influenza B by PCR NEGATIVE NEGATIVE    Comment: (NOTE) The Xpert Xpress SARS-CoV-2/FLU/RSV assay is intended as an aid in  the diagnosis of influenza from Nasopharyngeal swab specimens and  should not be used as a sole basis for treatment. Nasal washings and  aspirates are unacceptable for Xpert Xpress SARS-CoV-2/FLU/RSV  testing. Fact Sheet for Patients: https://www.moore.com/ Fact Sheet for Healthcare Providers: https://www.young.biz/ This test is not yet approved or cleared by the [place] FDA and  has been authorized for detection and/or  diagnosis of SARS-CoV-2 by  FDA under an Emergency Use Authorization (EUA). This EUA will remain  in effect (meaning this test can be used) for the duration of the  Covid-19 declaration under Section 564(b)(1) of the Act, 21  U.S.C. section 360bbb-3(b)(1), unless the authorization is  terminated or revoked. Performed at [facility], [address], [place], [place] [address]   Surgical pcr screen     Status: Abnormal   Collection Time: 09/01/19  3:35 AM   Specimen: Nasal Mucosa; Nasal Swab  Result Value Ref Range   MRSA, PCR NEGATIVE NEGATIVE   Staphylococcus aureus POSITIVE (A) NEGATIVE  Comment: (NOTE) The Xpert SA Assay (FDA approved for NASAL specimens in patients 48 years of age and older), is one component of a comprehensive surveillance program. It is not intended to diagnose infection nor to guide or monitor treatment.     US Abdomen Limited  Result Date: 09/01/2019 CLINICAL DATA:  Initial evaluation for acute right upper quadrant pain. EXAM: ULTRASOUND ABDOMEN LIMITED RIGHT UPPER QUADRANT COMPARISON:  None available. FINDINGS: Gallbladder: Echogenic stones seen within the gallbladder lumen, with dominant 1.9 cm stone seen lodged at the gallbladder neck. Gallbladder wall thickened up to 7.5 mm. Trace free pericholecystic fluid. No sonographic Murphy sign indicated by sonographer. Common bile duct: Diameter: Up to 5.3 mm. Liver: No focal lesion identified. Within normal limits in parenchymal echogenicity. Portal vein is patent on color Doppler imaging with normal direction of blood flow towards the liver. Other: None. IMPRESSION: 1. Cholelithiasis with associated gallbladder wall thickening and trace free pericholecystic fluid. Clinical correlation for possible acute cholecystitis recommended. 2. No biliary dilatation. Electronically Signed   By: Rise Mu M.D.   On: 09/01/2019 01:13    ROS - all of the below systems have been reviewed with the patient and  positives are indicated with bold text General: chills, fever or night sweats Eyes: blurry vision or double vision ENT: epistaxis or sore throat Allergy/Immunology: itchy/watery eyes or nasal congestion Hematologic/Lymphatic: bleeding problems, blood clots or swollen lymph nodes Endocrine: temperature intolerance or unexpected weight changes Breast: new or changing breast lumps or nipple discharge Resp: cough, shortness of breath, or wheezing CV: chest pain or dyspnea on exertion GI: as per HPI GU: dysuria, trouble voiding, or hematuria MSK: joint pain or joint stiffness Neuro: TIA or stroke symptoms Derm: pruritus and skin lesion changes Psych: anxiety and depression  PE Blood pressure (!) 145/74, pulse 73, temperature 97.9 F (36.6 C), temperature source Oral, resp. rate 17, SpO2 98 %. Constitutional: NAD; conversant; no deformities Eyes: Moist conjunctiva; no lid lag; anicteric; PERRL Neck: Trachea midline; no thyromegaly Lungs: Normal respiratory effort; no tactile fremitus CV: RRR; no palpable thrills; no pitting edema GI: Abd soft; no palpable hepatosplenomegaly, tenderness palpation right upper quadrant, no rebound, no guarding, active bowel sounds MSK: Normal range of motion of extremities; no clubbing/cyanosis Psychiatric: Appropriate affect; alert and oriented x3 Lymphatic: No palpable cervical or axillary lymphadenopathy  Results for orders placed or performed during the hospital encounter of 08/31/19 (from the past 48 hour(s))  Lipase, blood     Status: None   Collection Time: 08/31/19  4:29 PM  Result Value Ref Range   Lipase 25 11 - 51 U/L    Comment: Performed at [facility], [address] [place], [place] [address]  Comprehensive metabolic panel     Status: Abnormal   Collection Time: 08/31/19  4:29 PM  Result Value Ref Range   Sodium 139 135 - 145 mmol/L   Potassium 3.7 3.5 - 5.1 mmol/L   Chloride 104 98 - 111 mmol/L   CO2 23 22 - 32 mmol/L    Glucose, Bld 105 (H) 70 - 99 mg/dL    Comment: Glucose reference range applies only to samples taken after fasting for at least 8 hours.   BUN 13 6 - 20 mg/dL   Creatinine, Ser 4.78 0.44 - 1.00 mg/dL   Calcium 9.4 8.9 - 29.5 mg/dL   Total Protein 7.6 6.5 - 8.1 g/dL   Albumin 4.4 3.5 - 5.0 g/dL   AST 25 15 - 41 U/L  ALT 30 0 - 44 U/L   Alkaline Phosphatase 68 38 - 126 U/L   Total Bilirubin 0.7 0.3 - 1.2 mg/dL   GFR calc non Af Amer >60 >60 mL/min   GFR calc Af Amer >60 >60 mL/min   Anion gap 12 5 - 15    Comment: Performed at [facility], [address], [place], [place] [address]  CBC     Status: None   Collection Time: 08/31/19  4:29 PM  Result Value Ref Range   WBC 8.4 4.0 - 10.5 K/uL   RBC 4.75 3.87 - 5.11 MIL/uL   Hemoglobin 13.8 12.0 - 15.0 g/dL   HCT 19.7 58.8 - 32.5 %   MCV 89.1 80.0 - 100.0 fL   MCH 29.1 26.0 - 34.0 pg   MCHC 32.6 30.0 - 36.0 g/dL   RDW 49.8 26.4 - 15.8 %   Platelets 291 150 - 400 K/uL   nRBC 0.0 0.0 - 0.2 %    Comment: Performed at [facility], [address] [place], [place] [address]  Urinalysis, Routine w reflex microscopic     Status: Abnormal   Collection Time: 08/31/19  8:52 PM  Result Value Ref Range   Color, Urine YELLOW YELLOW   APPearance CLEAR CLEAR   Specific Gravity, Urine 1.021 1.005 - 1.030   pH 6.0 5.0 - 8.0   Glucose, UA NEGATIVE NEGATIVE mg/dL   Hgb urine dipstick SMALL (A) NEGATIVE   Bilirubin Urine NEGATIVE NEGATIVE   Ketones, ur NEGATIVE NEGATIVE mg/dL   Protein, ur NEGATIVE NEGATIVE mg/dL   Nitrite NEGATIVE NEGATIVE   Leukocytes,Ua MODERATE (A) NEGATIVE   RBC / HPF 6-10 0 - 5 RBC/hpf   WBC, UA 21-50 0 - 5 WBC/hpf   Bacteria, UA NONE SEEN NONE SEEN   Squamous Epithelial / LPF 0-5 0 - 5   Mucus PRESENT     Comment: Performed at [facility], [address], [place], [place] [address]  Respiratory Panel by RT PCR (Flu A&B, Covid) - Nasopharyngeal Swab     Status: None   Collection Time: 09/01/19  1:50  AM   Specimen: Nasopharyngeal Swab  Result Value Ref Range   SARS Coronavirus 2 by RT PCR NEGATIVE NEGATIVE    Comment: (NOTE) SARS-CoV-2 target nucleic acids are NOT DETECTED. The SARS-CoV-2 RNA is generally detectable in upper respiratoy specimens during the acute phase of infection. The lowest concentration of SARS-CoV-2 viral copies this assay can detect is 131 copies/mL. A negative result does not preclude SARS-Cov-2 infection and should not be used as the sole basis for treatment or other patient management decisions. A negative result may occur with  improper specimen collection/handling, submission of specimen other than nasopharyngeal swab, presence of viral mutation(s) within the areas targeted by this assay, and inadequate number of viral copies (<131 copies/mL). A negative result must be combined with clinical observations, patient history, and epidemiological information. The expected result is Negative. Fact Sheet for Patients:  https://www.moore.com/ Fact Sheet for Healthcare Providers:  https://www.young.biz/ This test is not yet ap proved or cleared by the [place] FDA and  has been authorized for detection and/or diagnosis of SARS-CoV-2 by FDA under an Emergency Use Authorization (EUA). This EUA will remain  in effect (meaning this test can be used) for the duration of the COVID-19 declaration under Section 564(b)(1) of the Act, 21 U.S.C. section 360bbb-3(b)(1), unless the authorization is terminated or revoked sooner.    Influenza A  by PCR NEGATIVE NEGATIVE   Influenza B by PCR NEGATIVE NEGATIVE    Comment: (NOTE) The Xpert Xpress SARS-CoV-2/FLU/RSV assay is intended as an aid in  the diagnosis of influenza from Nasopharyngeal swab specimens and  should not be used as a sole basis for treatment. Nasal washings and  aspirates are unacceptable for Xpert Xpress SARS-CoV-2/FLU/RSV  testing. Fact Sheet for  Patients: PinkCheek.be Fact Sheet for Healthcare Providers: GravelBags.it This test is not yet approved or cleared by the [place] FDA and  has been authorized for detection and/or diagnosis of SARS-CoV-2 by  FDA under an Emergency Use Authorization (EUA). This EUA will remain  in effect (meaning this test can be used) for the duration of the  Covid-19 declaration under Section 564(b)(1) of the Act, 21  U.S.C. section 360bbb-3(b)(1), unless the authorization is  terminated or revoked. Performed at [facility], [place], [place], [place] [address]   Surgical pcr screen     Status: Abnormal   Collection Time: 09/01/19  3:35 AM   Specimen: Nasal Mucosa; Nasal Swab  Result Value Ref Range   MRSA, PCR NEGATIVE NEGATIVE   Staphylococcus aureus POSITIVE (A) NEGATIVE    Comment: (NOTE) The Xpert SA Assay (FDA approved for NASAL specimens in patients 38 years of age and older), is one component of a comprehensive surveillance program. It is not intended to diagnose infection nor to guide or monitor treatment.     US Abdomen Limited  Result Date: 09/01/2019 CLINICAL DATA:  Initial evaluation for acute right upper quadrant pain. EXAM: ULTRASOUND ABDOMEN LIMITED RIGHT UPPER QUADRANT COMPARISON:  None available. FINDINGS: Gallbladder: Echogenic stones seen within the gallbladder lumen, with dominant 1.9 cm stone seen lodged at the gallbladder neck. Gallbladder wall thickened up to 7.5 mm. Trace free pericholecystic fluid. No sonographic Murphy sign indicated by sonographer. Common bile duct: Diameter: Up to 5.3 mm. Liver: No focal lesion identified. Within normal limits in parenchymal echogenicity. Portal vein is patent on color Doppler imaging with normal direction of blood flow towards the liver. Other: None. IMPRESSION: 1. Cholelithiasis with associated gallbladder wall thickening and trace free pericholecystic fluid.  Clinical correlation for possible acute cholecystitis recommended. 2. No biliary dilatation. Electronically Signed   By: Jeannine Boga M.D.   On: 09/01/2019 01:13     A/P: Joyce Allison is an 59 y.o. female with likely acute cholecystitis. Hyperlipidemia Hypothyroidism. 1.  Patient was given Rocephin in the ER. 2.  We will admit and keep n.p.o. 3.  We will discuss with Dr. Bobbye Morton for laparoscopic cholecystectomy Tuesday.  Ralene Ok, M.D., [facility] Use [email or web address] to contact on call provider

## 2019-09-01 NOTE — Anesthesia Procedure Notes (Signed)
Procedure Name: Intubation Date/Time: 09/01/2019 9:01 AM Performed by: Mayer Camel, CRNA Pre-anesthesia Checklist: Patient identified, Emergency Drugs available, Suction available and Patient being monitored Patient Re-evaluated:Patient Re-evaluated prior to induction Oxygen Delivery Method: Circle System Utilized Preoxygenation: Pre-oxygenation with 100% oxygen Induction Type: IV induction Ventilation: Mask ventilation without difficulty Laryngoscope Size: Miller and 2 Tube type: Oral Tube size: 7.0 mm Number of attempts: 1 Airway Equipment and Method: Stylet Placement Confirmation: ETT inserted through vocal cords under direct vision,  positive ETCO2 and breath sounds checked- equal and bilateral Secured at: 21 cm Tube secured with: Tape Dental Injury: Teeth and Oropharynx as per pre-operative assessment

## 2019-09-01 NOTE — Op Note (Signed)
   Operative Note  Date: 09/01/2019  Procedure: laparoscopic cholecystectomy  Pre-op diagnosis: acute cholecystitis Post-op diagnosis: Acute   cholecystitis, grade 1 Grade 1 cholecystitis: mildly inflamed gallbladder  Indication and clinical history: The patient is a 59 y.o. year old female with early cholecytstitis  Surgeon: Diamantina Monks, MD Assistant: Ventura Sellers, PA  Anesthesiologist: Hart Rochester, MD Anesthesia: General  Findings:  . Specimen: gallbladder . EBL: <5cc . Drains/Implants: none  Disposition: PACU - hemodynamically stable.  Description of procedure: The patient was positioned supine on the operating room table. Time-out was performed verifying correct patient, procedure, signature of informed consent, and administration of pre-operative antibiotics. General anesthetic induction and intubation were uneventful. The abdomen was prepped and draped in the usual sterile fashion. An infra-umbilical incision was made using an open technique using zero vicryl stay sutures on either side of the fascia and a 27mm Hassan port inserted. After establishing pneumoperitoneum, which the patient tolerated well, the abdominal cavity was inspected and no injury of any intra-abdominal structures was identified. Additional ports were placed under direct visualization and using local anesthetic: two 37mm ports in the right subcostal region and a 21mm port in the epigastric region. The patient was re-positioned to reverse Trendelenburg and right side up. The gallbladder was retracted cephalad. The infundibulum was identified and retracted toward the right lower quadrant. The peritoneum was incised over the infundibulum and the triangle of Calot dissected to expose the critical view of safety. With clear identification and isolation of the cystic duct and cystic artery, the cystic artery was doubly clipped and divided. After this, the cystic duct was identified as a single structure entering the gallbladder,  and was also doubly clipped and divided. The gallbladder was dissected off the liver bed using electrocautery and hemostasis of the liver bed was confirmed prior to separation of the final peritoneal attachments of the gallbladder to the liver bed. There was spillage of bile during dissection and the gallbladder fossa was irrigated and suctioned. After transection of the final peritoneal attachments, the gallbladder was placed in an endoscopic specimen retrieval bag, removed via the umbilical port site, and sent to pathology as a frozen specimen. The gallbladder fossa was inspected confirming hemostasis, the absence of bile leakage from the cystic duct stump, and correct placement of clips on the cystic artery and cystic duct stumps. The abdomen was desufflated and the fascia of the umbilical port site was closed using the previously placed stay sutures. Additional local anesthetic was administered at the umbilical port site.  The skin of all incisions was closed with 4-0 monocryl. Sterile dressings were applied with dermabond. All sponge and instrument counts were correct at the conclusion of the procedure. The patient was awakened from anesthesia, extubated uneventfully, and transported to the PACU - hemodynamically stable.. There were no complications.   Husband, Loray Akard, notified via phone post-op.    Diamantina Monks, MD General and Trauma Surgery [facility]

## 2019-09-01 NOTE — Discharge Instructions (Signed)
May shower beginning 09/02/2019. Do not peel off or scrub skin glue. May allow warm soapy water to run over incision, then rinse and pat dry. Do not soak in any water (tubs, hot tubs, pools, lakes, oceans) for one week.   No lifting greater than 5 pounds for six weeks.   Call the office at [phone] for temperature greater than 101.42F, worsening pain, redness or warmth at the incision site.  Please call [phone] to make an appointment for 1 week after surgery for wound check.    [facility] LAPAROSCOPIC SURGERY: POST OP INSTRUCTIONS Always review your discharge instruction sheet given to you by the facility where your surgery was performed. IF YOU HAVE DISABILITY OR FAMILY LEAVE FORMS, YOU MUST BRING THEM TO THE OFFICE FOR PROCESSING.   DO NOT GIVE THEM TO YOUR DOCTOR.  PAIN CONTROL  1. First take acetaminophen (Tylenol) AND/or ibuprofen (Advil) to control your pain after surgery.  Follow directions on package.  Taking acetaminophen (Tylenol) and/or ibuprofen (Advil) regularly after surgery will help to control your pain and lower the amount of prescription pain medication you may need.  You should not take more than 3,000 mg (3 grams) of acetaminophen (Tylenol) in 24 hours.  You should not take ibuprofen (Advil), aleve, motrin, naprosyn or other NSAIDS if you have a history of stomach ulcers or chronic kidney disease.  2. A prescription for pain medication may be given to you upon discharge.  Take your pain medication as prescribed, if you still have uncontrolled pain after taking acetaminophen (Tylenol) or ibuprofen (Advil). 3. Use ice packs to help control pain. 4. If you need a refill on your pain medication, please contact your pharmacy.  They will contact our office to request authorization. Prescriptions will not be filled after 5pm or on week-ends.  HOME MEDICATIONS 5. Take your usually prescribed medications unless otherwise directed.  DIET 6. You should  follow a light diet the first few days after arrival home.  Be sure to include lots of fluids daily. Avoid fatty, fried foods.   CONSTIPATION 7. It is common to experience some constipation after surgery and if you are taking pain medication.  Increasing fluid intake and taking a stool softener (such as Colace) will usually help or prevent this problem from occurring.  A mild laxative (Milk of Magnesia or Miralax) should be taken according to package instructions if there are no bowel movements after 48 hours.  WOUND/INCISION CARE 8. Most patients will experience some swelling and bruising in the area of the incisions.  Ice packs will help.  Swelling and bruising can take several days to resolve.  9. Unless discharge instructions indicate otherwise, follow guidelines below  a. STERI-STRIPS - you may remove your outer bandages 48 hours after surgery, and you may shower at that time.  You have steri-strips (small skin tapes) in place directly over the incision.  These strips should be left on the skin for 7-10 days.   b. DERMABOND/SKIN GLUE - you may shower in 24 hours.  The glue will flake off over the next 2-3 weeks. 10. Any sutures or staples will be removed at the office during your follow-up visit.  ACTIVITIES 11. You may resume regular (light) daily activities beginning the next day--such as daily self-care, walking, climbing stairs--gradually increasing activities as tolerated.  You may have sexual intercourse when it is comfortable.  Refrain from any heavy lifting or straining until approved by your doctor. a. You may drive when you  are no longer taking prescription pain medication, you can comfortably wear a seatbelt, and you can safely maneuver your car and apply brakes.  FOLLOW-UP 12. You should see your doctor in the office for a follow-up appointment approximately 2-3 weeks after your surgery.  You should have been given your post-op/follow-up appointment when your surgery was scheduled.   If you did not receive a post-op/follow-up appointment, make sure that you call for this appointment within a day or two after you arrive home to insure a convenient appointment time.   WHEN TO CALL YOUR DOCTOR: 1. Fever over 101.0 2. Inability to urinate 3. Continued bleeding from incision. 4. Increased pain, redness, or drainage from the incision. 5. Increasing abdominal pain  The clinic staff is available to answer your questions during regular business hours.  Please don't hesitate to call and ask to speak to one of the nurses for clinical concerns.  If you have a medical emergency, go to the nearest emergency room or call 911.  A surgeon from [facility] is always on call at the hospital. [address] [place], [place], [place]  [address] ? [address], [place], [place]   [address] [phone] ? [phone] ? FAX [phone] Web site: [email or web address] ........Marland Kitchen   Managing Your Pain After Surgery Without Opioids    Thank you for participating in our program to help patients manage their pain after surgery without opioids. This is part of our effort to provide you with the best care possible, without exposing you or your family to the risk that opioids pose.  What pain can I expect after surgery? You can expect to have some pain after surgery. This is normal. The pain is typically worse the day after surgery, and quickly begins to get better. Many studies have found that many patients are able to manage their pain after surgery with Over-the-Counter (OTC) medications such as Tylenol and Motrin. If you have a condition that does not allow you to take Tylenol or Motrin, notify your surgical team.  How will I manage my pain? The best strategy for controlling your pain after surgery is around the clock pain control with Tylenol (acetaminophen) and Motrin (ibuprofen or Advil). Alternating these medications with each other allows you to maximize your  pain control. In addition to Tylenol and Motrin, you can use heating pads or ice packs on your incisions to help reduce your pain.  How will I alternate your regular strength over-the-counter pain medication? You will take a dose of pain medication every three hours. ; Start by taking 650 mg of Tylenol (2 pills of 325 mg) ; 3 hours later take 600 mg of Motrin (3 pills of 200 mg) ; 3 hours after taking the Motrin take 650 mg of Tylenol ; 3 hours after that take 600 mg of Motrin.   - 1 -  See example - if your first dose of Tylenol is at 12:00 PM   12:00 PM Tylenol 650 mg (2 pills of 325 mg)  3:00 PM Motrin 600 mg (3 pills of 200 mg)  6:00 PM Tylenol 650 mg (2 pills of 325 mg)  9:00 PM Motrin 600 mg (3 pills of 200 mg)  Continue alternating every 3 hours   We recommend that you follow this schedule around-the-clock for at least 3 days after surgery, or until you feel that it is no longer needed. Use the table on the last page of this handout to keep track of the medications  you are taking. Important: Do not take more than 3000mg  of Tylenol or 3200mg  of Motrin in a 24-hour period. Do not take ibuprofen/Motrin if you have a history of bleeding stomach ulcers, severe kidney disease, &/or actively taking a blood thinner  What if I still have pain? If you have pain that is not controlled with the over-the-counter pain medications (Tylenol and Motrin or Advil) you might have what we call "breakthrough" pain. You will receive a prescription for a small amount of an opioid pain medication such as Oxycodone, Tramadol, or Tylenol with Codeine. Use these opioid pills in the first 24 hours after surgery if you have breakthrough pain. Do not take more than 1 pill every 4-6 hours.  If you still have uncontrolled pain after using all opioid pills, don't hesitate to call our staff using the number provided. We will help make sure you are managing your pain in the best way possible, and if necessary, we  can provide a prescription for additional pain medication.   Day 1    Time  Name of Medication Number of pills taken  Amount of Acetaminophen  Pain Level   Comments  AM PM       AM PM       AM PM       AM PM       AM PM       AM PM       AM PM       AM PM       Total Daily amount of Acetaminophen Do not take more than  3,000 mg per day      Day 2    Time  Name of Medication Number of pills taken  Amount of Acetaminophen  Pain Level   Comments  AM PM       AM PM       AM PM       AM PM       AM PM       AM PM       AM PM       AM PM       Total Daily amount of Acetaminophen Do not take more than  3,000 mg per day      Day 3    Time  Name of Medication Number of pills taken  Amount of Acetaminophen  Pain Level   Comments  AM PM       AM PM       AM PM       AM PM          AM PM       AM PM       AM PM       AM PM       Total Daily amount of Acetaminophen Do not take more than  3,000 mg per day      Day 4    Time  Name of Medication Number of pills taken  Amount of Acetaminophen  Pain Level   Comments  AM PM       AM PM       AM PM       AM PM       AM PM       AM PM       AM PM       AM PM       Total Daily amount of Acetaminophen Do  not take more than  3,000 mg per day      Day 5    Time  Name of Medication Number of pills taken  Amount of Acetaminophen  Pain Level   Comments  AM PM       AM PM       AM PM       AM PM       AM PM       AM PM       AM PM       AM PM       Total Daily amount of Acetaminophen Do not take more than  3,000 mg per day       Day 6    Time  Name of Medication Number of pills taken  Amount of Acetaminophen  Pain Level  Comments  AM PM       AM PM       AM PM       AM PM       AM PM       AM PM       AM PM       AM PM       Total Daily amount of Acetaminophen Do not take more than  3,000 mg per day      Day 7    Time  Name of Medication Number of pills taken    Amount of Acetaminophen  Pain Level   Comments  AM PM       AM PM       AM PM       AM PM       AM PM       AM PM       AM PM       AM PM       Total Daily amount of Acetaminophen Do not take more than  3,000 mg per day        For additional information about how and where to safely dispose of unused opioid medications - [email or web address]  Disclaimer: This document contains information and/or instructional materials adapted from [facility] for the typical patient with your condition. It does not replace medical advice from your health care provider because your experience may differ from that of the typical patient. Talk to your health care provider if you have any questions about this document, your condition or your treatment plan. Adapted from [facility]

## 2019-09-01 NOTE — ED Notes (Signed)
Family at bedside. 

## 2019-09-01 NOTE — Transfer of Care (Signed)
Immediate Anesthesia Transfer of Care Note  Patient: Joyce Allison  Procedure(s) Performed: LAPAROSCOPIC CHOLECYSTECTOMY (N/A Abdomen)  Patient Location: PACU  Anesthesia Type:General  Level of Consciousness: awake, alert  and oriented  Airway & Oxygen Therapy: Patient Spontanous Breathing and Patient connected to face mask oxygen  Post-op Assessment: Report given to RN and Post -op Vital signs reviewed and stable  Post vital signs: Reviewed and stable  Last Vitals:  Vitals Value Taken Time  BP 151/112 09/01/19 1017  Temp    Pulse 94 09/01/19 1023  Resp 18 09/01/19 1023  SpO2 94 % 09/01/19 1023  Vitals shown include unvalidated device data.  Last Pain:  Vitals:   09/01/19 0803  TempSrc:   PainSc: 5       Patients Stated Pain Goal: 0 (09/01/19 0803)  Complications: No apparent anesthesia complications

## 2019-09-01 NOTE — Progress Notes (Signed)
Patient seen and examined. RUQ pain of sudden onset last night "I thought something was bursting". Pain radiates to back and epigastrium, associated with diarrhea. Previous episodes in the same location, but never this painful, first started about 4 months ago, now happening more frequently. Large stone at the neck of the gallbladder, +PCF on [place], normal transaminases and WBC. Prior BTL in 1985. Plan for lap chole today. Informed consent was obtained after detailed explanation of risks, including bleeding, infection, biloma, need for IOC to delineate anatomy, and need for conversion to open procedure. All questions answered to the patient's satisfaction.  Husband Trevia Nop [phone] to be contacted post-procedure.   Diamantina Monks, MD General and Trauma Surgery [facility]

## 2019-09-01 NOTE — Anesthesia Preprocedure Evaluation (Addendum)
Anesthesia Evaluation  Patient identified by MRN, date of birth, ID band Patient awake    Reviewed: Allergy & Precautions, NPO status , Patient's Chart, lab work & pertinent test results  Airway Mallampati: II  TM Distance: >3 FB Neck ROM: Full    Dental  (+) Teeth Intact, Dental Advisory Given   Pulmonary neg pulmonary ROS,    breath sounds clear to auscultation       Cardiovascular negative cardio ROS   Rhythm:Regular Rate:Normal     Neuro/Psych negative neurological ROS  negative psych ROS   GI/Hepatic negative GI ROS, Neg liver ROS,   Endo/Other  negative endocrine ROS  Renal/GU negative Renal ROS     Musculoskeletal negative musculoskeletal ROS (+)   Abdominal Normal abdominal exam  (+)   Peds  Hematology negative hematology ROS (+)   Anesthesia Other Findings   Reproductive/Obstetrics                            Lab Results  Component Value Date   WBC 8.4 08/31/2019   HGB 13.8 08/31/2019   HCT 42.3 08/31/2019   MCV 89.1 08/31/2019   PLT 291 08/31/2019   Lab Results  Component Value Date   CREATININE 0.77 08/31/2019   BUN 13 08/31/2019   NA 139 08/31/2019   K 3.7 08/31/2019   CL 104 08/31/2019   CO2 23 08/31/2019   No results found for: INR, PROTIME   Anesthesia Physical Anesthesia Plan  ASA: II  Anesthesia Plan: General   Post-op Pain Management:    Induction: Intravenous  PONV Risk Score and Plan: 4 or greater and Ondansetron, Dexamethasone, Midazolam and Scopolamine patch - Pre-op  Airway Management Planned: Oral ETT  Additional Equipment: None  Intra-op Plan:   Post-operative Plan: Extubation in OR  Informed Consent: I have reviewed the patients History and Physical, chart, labs and discussed the procedure including the risks, benefits and alternatives for the proposed anesthesia with the patient or authorized representative who has indicated his/her  understanding and acceptance.     Dental advisory given  Plan Discussed with: CRNA  Anesthesia Plan Comments:        Anesthesia Quick Evaluation

## 2019-09-02 ENCOUNTER — Encounter: Payer: Self-pay | Admitting: *Deleted

## 2019-09-02 LAB — SURGICAL PATHOLOGY

## 2019-09-02 NOTE — Anesthesia Postprocedure Evaluation (Signed)
Anesthesia Post Note  Patient: Joyce Allison  Procedure(s) Performed: LAPAROSCOPIC CHOLECYSTECTOMY (N/A Abdomen)     Patient location during evaluation: PACU Anesthesia Type: General Level of consciousness: awake and alert Pain management: pain level controlled Vital Signs Assessment: post-procedure vital signs reviewed and stable Respiratory status: spontaneous breathing, nonlabored ventilation, respiratory function stable and patient connected to nasal cannula oxygen Cardiovascular status: blood pressure returned to baseline and stable Postop Assessment: no apparent nausea or vomiting Anesthetic complications: no    Last Vitals:  Vitals:   09/01/19 1155 09/01/19 1410  BP: 127/81 130/72  Pulse: 75 78  Resp: 16 17  Temp: (!) 36.4 C 36.5 C  SpO2: 95% 91%    Last Pain:  Vitals:   09/01/19 1410  TempSrc: Oral  PainSc:                  [name]

## 2019-12-07 DIAGNOSIS — E038 Other specified hypothyroidism: Secondary | ICD-10-CM | POA: Diagnosis not present

## 2019-12-07 DIAGNOSIS — Z79899 Other long term (current) drug therapy: Secondary | ICD-10-CM | POA: Diagnosis not present

## 2019-12-07 DIAGNOSIS — E7800 Pure hypercholesterolemia, unspecified: Secondary | ICD-10-CM | POA: Diagnosis not present

## 2019-12-07 DIAGNOSIS — E559 Vitamin D deficiency, unspecified: Secondary | ICD-10-CM | POA: Diagnosis not present

## 2020-02-04 DIAGNOSIS — R0781 Pleurodynia: Secondary | ICD-10-CM | POA: Diagnosis not present

## 2020-03-11 ENCOUNTER — Other Ambulatory Visit: Payer: Self-pay | Admitting: Internal Medicine

## 2020-03-11 DIAGNOSIS — M5416 Radiculopathy, lumbar region: Secondary | ICD-10-CM

## 2020-03-24 ENCOUNTER — Other Ambulatory Visit: Payer: Self-pay

## 2020-04-26 DIAGNOSIS — S29012D Strain of muscle and tendon of back wall of thorax, subsequent encounter: Secondary | ICD-10-CM | POA: Diagnosis not present

## 2020-05-02 ENCOUNTER — Other Ambulatory Visit: Payer: Self-pay | Admitting: Gastroenterology

## 2020-05-02 DIAGNOSIS — R1011 Right upper quadrant pain: Secondary | ICD-10-CM

## 2020-05-02 DIAGNOSIS — M549 Dorsalgia, unspecified: Secondary | ICD-10-CM

## 2020-05-02 DIAGNOSIS — R140 Abdominal distension (gaseous): Secondary | ICD-10-CM | POA: Diagnosis not present

## 2020-05-02 DIAGNOSIS — R194 Change in bowel habit: Secondary | ICD-10-CM | POA: Diagnosis not present

## 2020-05-06 DIAGNOSIS — S29012D Strain of muscle and tendon of back wall of thorax, subsequent encounter: Secondary | ICD-10-CM | POA: Diagnosis not present

## 2020-05-10 DIAGNOSIS — S29012D Strain of muscle and tendon of back wall of thorax, subsequent encounter: Secondary | ICD-10-CM | POA: Diagnosis not present

## 2020-05-12 ENCOUNTER — Ambulatory Visit
Admission: RE | Admit: 2020-05-12 | Discharge: 2020-05-12 | Disposition: A | Discharge status: Home / Self Care | Payer: BC Managed Care – PPO | Source: Ambulatory Visit | Attending: Gastroenterology | Admitting: Gastroenterology

## 2020-05-12 DIAGNOSIS — Z9049 Acquired absence of other specified parts of digestive tract: Secondary | ICD-10-CM | POA: Diagnosis not present

## 2020-05-12 DIAGNOSIS — R140 Abdominal distension (gaseous): Secondary | ICD-10-CM

## 2020-05-12 DIAGNOSIS — M549 Dorsalgia, unspecified: Secondary | ICD-10-CM

## 2020-05-12 DIAGNOSIS — R1011 Right upper quadrant pain: Secondary | ICD-10-CM

## 2020-05-12 DIAGNOSIS — S29012D Strain of muscle and tendon of back wall of thorax, subsequent encounter: Secondary | ICD-10-CM | POA: Diagnosis not present

## 2020-05-12 DIAGNOSIS — K5730 Diverticulosis of large intestine without perforation or abscess without bleeding: Secondary | ICD-10-CM | POA: Diagnosis not present

## 2020-05-12 MED ORDER — IOPAMIDOL (ISOVUE-300) INJECTION 61%
100.0000 mL | Freq: Once | INTRAVENOUS | Status: AC | PRN
  Administered 2020-05-12: 100 mL via INTRAVENOUS

## 2020-05-17 DIAGNOSIS — S29012D Strain of muscle and tendon of back wall of thorax, subsequent encounter: Secondary | ICD-10-CM | POA: Diagnosis not present

## 2020-05-23 DIAGNOSIS — S29012D Strain of muscle and tendon of back wall of thorax, subsequent encounter: Secondary | ICD-10-CM | POA: Diagnosis not present

## 2020-05-25 DIAGNOSIS — S29012D Strain of muscle and tendon of back wall of thorax, subsequent encounter: Secondary | ICD-10-CM | POA: Diagnosis not present

## 2020-05-31 DIAGNOSIS — S29012D Strain of muscle and tendon of back wall of thorax, subsequent encounter: Secondary | ICD-10-CM | POA: Diagnosis not present

## 2020-06-20 DIAGNOSIS — Z1159 Encounter for screening for other viral diseases: Secondary | ICD-10-CM | POA: Diagnosis not present

## 2020-06-20 HISTORY — PX: COLONOSCOPY: SHX174

## 2020-06-23 DIAGNOSIS — R140 Abdominal distension (gaseous): Secondary | ICD-10-CM | POA: Diagnosis not present

## 2020-06-23 DIAGNOSIS — R1011 Right upper quadrant pain: Secondary | ICD-10-CM | POA: Diagnosis not present

## 2020-06-23 DIAGNOSIS — K5730 Diverticulosis of large intestine without perforation or abscess without bleeding: Secondary | ICD-10-CM | POA: Diagnosis not present

## 2020-06-23 DIAGNOSIS — D123 Benign neoplasm of transverse colon: Secondary | ICD-10-CM | POA: Diagnosis not present

## 2020-06-23 DIAGNOSIS — K3189 Other diseases of stomach and duodenum: Secondary | ICD-10-CM | POA: Diagnosis not present

## 2020-06-23 DIAGNOSIS — K2930 Chronic superficial gastritis without bleeding: Secondary | ICD-10-CM | POA: Diagnosis not present

## 2020-06-23 DIAGNOSIS — D124 Benign neoplasm of descending colon: Secondary | ICD-10-CM | POA: Diagnosis not present

## 2020-06-23 DIAGNOSIS — R197 Diarrhea, unspecified: Secondary | ICD-10-CM | POA: Diagnosis not present

## 2020-06-23 DIAGNOSIS — R194 Change in bowel habit: Secondary | ICD-10-CM | POA: Diagnosis not present

## 2020-06-23 DIAGNOSIS — D122 Benign neoplasm of ascending colon: Secondary | ICD-10-CM | POA: Diagnosis not present

## 2020-06-23 DIAGNOSIS — K635 Polyp of colon: Secondary | ICD-10-CM | POA: Diagnosis not present

## 2020-06-23 DIAGNOSIS — D120 Benign neoplasm of cecum: Secondary | ICD-10-CM | POA: Diagnosis not present

## 2020-08-31 ENCOUNTER — Other Ambulatory Visit: Payer: Self-pay | Admitting: Radiology

## 2020-08-31 DIAGNOSIS — Z1231 Encounter for screening mammogram for malignant neoplasm of breast: Secondary | ICD-10-CM | POA: Diagnosis not present

## 2020-08-31 DIAGNOSIS — N6331 Unspecified lump in axillary tail of the right breast: Secondary | ICD-10-CM | POA: Diagnosis not present

## 2020-08-31 DIAGNOSIS — Z6828 Body mass index (BMI) 28.0-28.9, adult: Secondary | ICD-10-CM | POA: Diagnosis not present

## 2020-08-31 DIAGNOSIS — N6310 Unspecified lump in the right breast, unspecified quadrant: Secondary | ICD-10-CM

## 2020-08-31 DIAGNOSIS — Z01419 Encounter for gynecological examination (general) (routine) without abnormal findings: Secondary | ICD-10-CM | POA: Diagnosis not present

## 2020-09-28 ENCOUNTER — Other Ambulatory Visit: Payer: Self-pay | Admitting: Radiology

## 2020-09-28 ENCOUNTER — Other Ambulatory Visit: Payer: Self-pay

## 2020-09-28 ENCOUNTER — Ambulatory Visit
Admission: RE | Admit: 2020-09-28 | Discharge: 2020-09-28 | Disposition: A | Discharge status: Home / Self Care | Payer: BC Managed Care – PPO | Source: Ambulatory Visit | Attending: Radiology | Admitting: Radiology

## 2020-09-28 DIAGNOSIS — N6310 Unspecified lump in the right breast, unspecified quadrant: Secondary | ICD-10-CM

## 2020-09-28 DIAGNOSIS — N644 Mastodynia: Secondary | ICD-10-CM | POA: Diagnosis not present

## 2020-09-28 DIAGNOSIS — R922 Inconclusive mammogram: Secondary | ICD-10-CM | POA: Diagnosis not present

## 2020-10-13 ENCOUNTER — Other Ambulatory Visit: Payer: BC Managed Care – PPO

## 2021-06-29 DIAGNOSIS — Z79899 Other long term (current) drug therapy: Secondary | ICD-10-CM | POA: Diagnosis not present

## 2021-06-29 DIAGNOSIS — E7800 Pure hypercholesterolemia, unspecified: Secondary | ICD-10-CM | POA: Diagnosis not present

## 2021-06-29 DIAGNOSIS — Z0000 Encounter for general adult medical examination without abnormal findings: Secondary | ICD-10-CM | POA: Diagnosis not present

## 2021-06-29 DIAGNOSIS — E039 Hypothyroidism, unspecified: Secondary | ICD-10-CM | POA: Diagnosis not present

## 2021-06-29 DIAGNOSIS — E559 Vitamin D deficiency, unspecified: Secondary | ICD-10-CM | POA: Diagnosis not present

## 2021-09-28 DIAGNOSIS — Z1231 Encounter for screening mammogram for malignant neoplasm of breast: Secondary | ICD-10-CM | POA: Diagnosis not present

## 2021-09-28 DIAGNOSIS — Z6827 Body mass index (BMI) 27.0-27.9, adult: Secondary | ICD-10-CM | POA: Diagnosis not present

## 2021-09-28 DIAGNOSIS — Z124 Encounter for screening for malignant neoplasm of cervix: Secondary | ICD-10-CM | POA: Diagnosis not present

## 2021-09-28 DIAGNOSIS — Z01419 Encounter for gynecological examination (general) (routine) without abnormal findings: Secondary | ICD-10-CM | POA: Diagnosis not present

## 2021-09-28 DIAGNOSIS — Z13820 Encounter for screening for osteoporosis: Secondary | ICD-10-CM | POA: Diagnosis not present

## 2021-09-28 DIAGNOSIS — Z1151 Encounter for screening for human papillomavirus (HPV): Secondary | ICD-10-CM | POA: Diagnosis not present

## 2022-02-01 ENCOUNTER — Ambulatory Visit
Admission: RE | Admit: 2022-02-01 | Discharge: 2022-02-01 | Disposition: A | Discharge status: Home / Self Care | Payer: BC Managed Care – PPO | Source: Ambulatory Visit | Attending: Gastroenterology | Admitting: Gastroenterology

## 2022-02-01 ENCOUNTER — Other Ambulatory Visit: Payer: Self-pay | Admitting: Gastroenterology

## 2022-02-01 DIAGNOSIS — R197 Diarrhea, unspecified: Secondary | ICD-10-CM | POA: Diagnosis not present

## 2022-02-01 DIAGNOSIS — R152 Fecal urgency: Secondary | ICD-10-CM | POA: Diagnosis not present

## 2022-02-01 DIAGNOSIS — Z9049 Acquired absence of other specified parts of digestive tract: Secondary | ICD-10-CM | POA: Diagnosis not present

## 2022-02-01 DIAGNOSIS — R195 Other fecal abnormalities: Secondary | ICD-10-CM

## 2022-02-01 DIAGNOSIS — K625 Hemorrhage of anus and rectum: Secondary | ICD-10-CM | POA: Diagnosis not present

## 2022-02-01 DIAGNOSIS — K648 Other hemorrhoids: Secondary | ICD-10-CM | POA: Diagnosis not present

## 2022-02-01 DIAGNOSIS — K644 Residual hemorrhoidal skin tags: Secondary | ICD-10-CM | POA: Diagnosis not present

## 2022-08-09 DIAGNOSIS — M7918 Myalgia, other site: Secondary | ICD-10-CM | POA: Diagnosis not present

## 2022-08-09 DIAGNOSIS — M85852 Other specified disorders of bone density and structure, left thigh: Secondary | ICD-10-CM | POA: Diagnosis not present

## 2022-08-09 DIAGNOSIS — Z0000 Encounter for general adult medical examination without abnormal findings: Secondary | ICD-10-CM | POA: Diagnosis not present

## 2022-08-09 DIAGNOSIS — E039 Hypothyroidism, unspecified: Secondary | ICD-10-CM | POA: Diagnosis not present

## 2022-08-09 DIAGNOSIS — E7800 Pure hypercholesterolemia, unspecified: Secondary | ICD-10-CM | POA: Diagnosis not present

## 2022-08-09 DIAGNOSIS — E559 Vitamin D deficiency, unspecified: Secondary | ICD-10-CM | POA: Diagnosis not present

## 2022-11-09 DIAGNOSIS — Z5181 Encounter for therapeutic drug level monitoring: Secondary | ICD-10-CM | POA: Diagnosis not present

## 2022-11-09 DIAGNOSIS — E7800 Pure hypercholesterolemia, unspecified: Secondary | ICD-10-CM | POA: Diagnosis not present

## 2022-11-14 DIAGNOSIS — Z01419 Encounter for gynecological examination (general) (routine) without abnormal findings: Secondary | ICD-10-CM | POA: Diagnosis not present

## 2022-11-14 DIAGNOSIS — Z1231 Encounter for screening mammogram for malignant neoplasm of breast: Secondary | ICD-10-CM | POA: Diagnosis not present

## 2022-11-14 DIAGNOSIS — Z6828 Body mass index (BMI) 28.0-28.9, adult: Secondary | ICD-10-CM | POA: Diagnosis not present

## 2022-11-19 DIAGNOSIS — E7800 Pure hypercholesterolemia, unspecified: Secondary | ICD-10-CM | POA: Diagnosis not present

## 2022-11-20 ENCOUNTER — Other Ambulatory Visit (HOSPITAL_COMMUNITY): Payer: Self-pay | Admitting: Internal Medicine

## 2022-11-20 DIAGNOSIS — E7800 Pure hypercholesterolemia, unspecified: Secondary | ICD-10-CM

## 2022-11-21 ENCOUNTER — Ambulatory Visit (HOSPITAL_BASED_OUTPATIENT_CLINIC_OR_DEPARTMENT_OTHER)
Admission: RE | Admit: 2022-11-21 | Discharge: 2022-11-21 | Disposition: A | Discharge status: Home / Self Care | Payer: BC Managed Care – PPO | Source: Ambulatory Visit | Attending: Internal Medicine | Admitting: Internal Medicine

## 2022-11-21 DIAGNOSIS — E7800 Pure hypercholesterolemia, unspecified: Secondary | ICD-10-CM | POA: Insufficient documentation

## 2022-12-17 IMAGING — US US BREAST*R* LIMITED INC AXILLA
1 series · 1 of 1 positions shown · non-contrast
Comparison: Previous exam(s).

CLINICAL DATA: 59-year-old female presenting with generalized
tenderness in the outer right breast/chest wall for approximately 6
months. Additionally the patient's physician feels a possible lump.
Patient states the pain has improved and she previously felt a lump
in this area but is no longer able to.

EXAM:
DIGITAL DIAGNOSTIC UNILATERAL RIGHT MAMMOGRAM WITH TOMOSYNTHESIS AND
CAD; ULTRASOUND RIGHT BREAST LIMITED
TECHNIQUE: Right digital diagnostic mammography and breast tomosynthesis was
performed. The images were evaluated with computer-aided detection.;
Targeted ultrasound examination of the right breast was performed

[Series 1: us breast*right* limited inc axilla · 0.06mm/px · 1 of 1 slices shown]
[im 1/1]
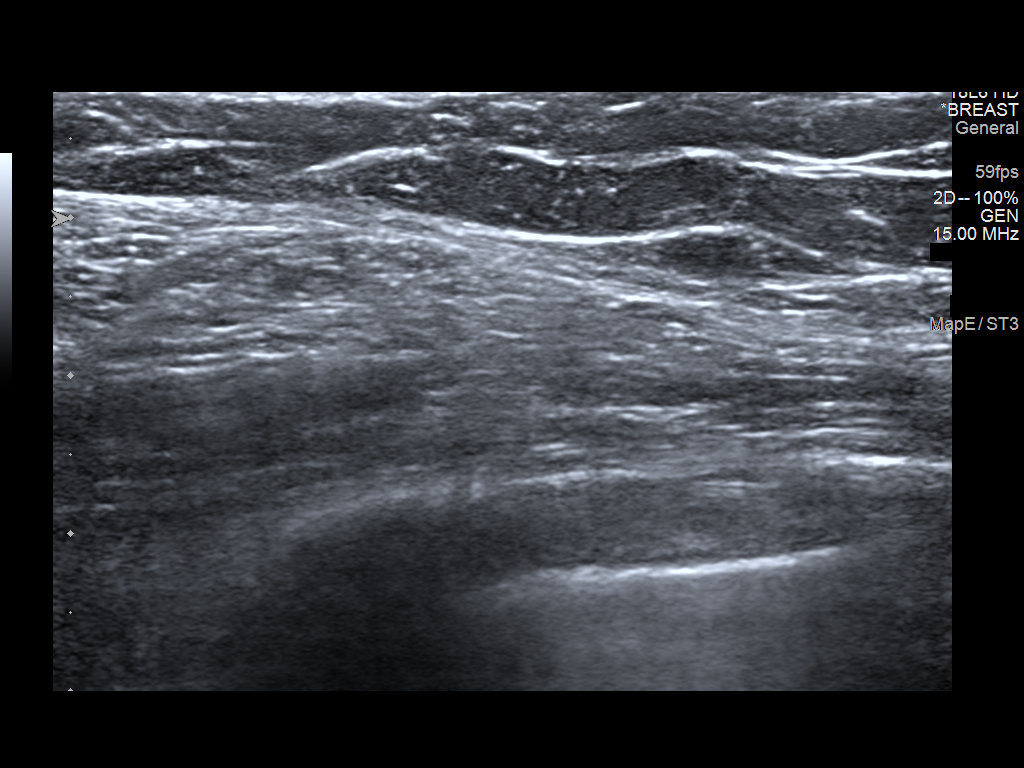

[1 of 1 positions shown; findings below may reference images not displayed]

ACR Breast Density Category c: The breast tissue is heterogeneously
dense, which may obscure small masses.
FINDINGS: Mammogram:

Full field tomosynthesis views of the right breast were performed.
No suspicious mass, distortion, or microcalcifications are
identified to suggest presence of malignancy. There is no abnormal
lymph node in the visualized right axilla.

On physical exam at the site of tenderness/possible lump in the
outer right breast I do not feel a discrete abnormality. There are
no obvious skin changes or bruising.

Ultrasound:

Targeted ultrasound is performed at the site of tenderness/possible
lump in the right breast at 9 o'clock 12 cm from the nipple
demonstrating no cystic or solid mass. No focal fluid collection.
IMPRESSION: No mammographic or sonographic evidence of malignancy or other
imaging abnormality at the site of tenderness reported by the
patient in the far outer right breast/chest wall.

RECOMMENDATION:
1. Recommend any additional workup of the right breast
tenderness/palpable site be on a clinical basis.

2.  Screening mammogram in one year.(Code:HI-N-U3F)

I have discussed the findings and recommendations with the patient.
If applicable, a reminder letter will be sent to the patient
regarding the next appointment.

BI-RADS CATEGORY  1: Negative.

## 2022-12-17 IMAGING — MG MM DIGITAL DIAGNOSTIC UNILAT*R* W/ TOMO W/ CAD
6 series · 6 of 18 positions shown · non-contrast
Comparison: Previous exam(s).

CLINICAL DATA: 59-year-old female presenting with generalized
tenderness in the outer right breast/chest wall for approximately 6
months. Additionally the patient's physician feels a possible lump.
Patient states the pain has improved and she previously felt a lump
in this area but is no longer able to.

EXAM:
DIGITAL DIAGNOSTIC UNILATERAL RIGHT MAMMOGRAM WITH TOMOSYNTHESIS AND
CAD; ULTRASOUND RIGHT BREAST LIMITED
TECHNIQUE: Right digital diagnostic mammography and breast tomosynthesis was
performed. The images were evaluated with computer-aided detection.;
Targeted ultrasound examination of the right breast was performed

[R CC synth-2D]
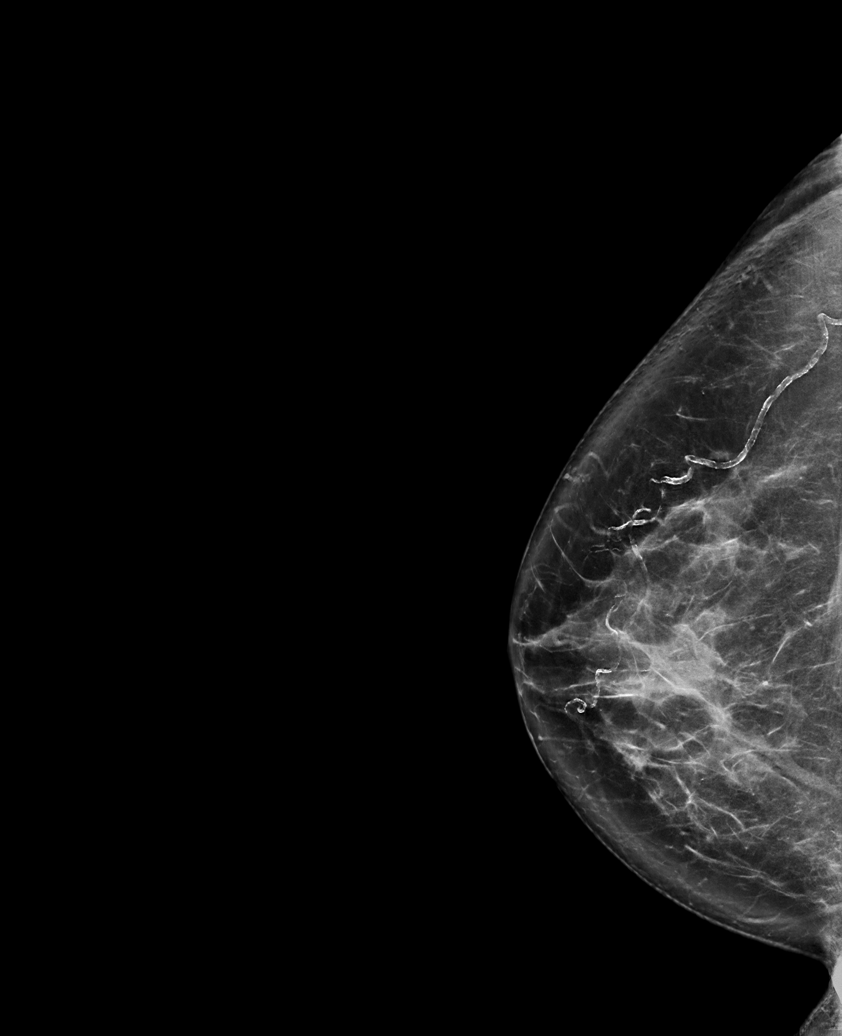

[R MLO synth-2D]
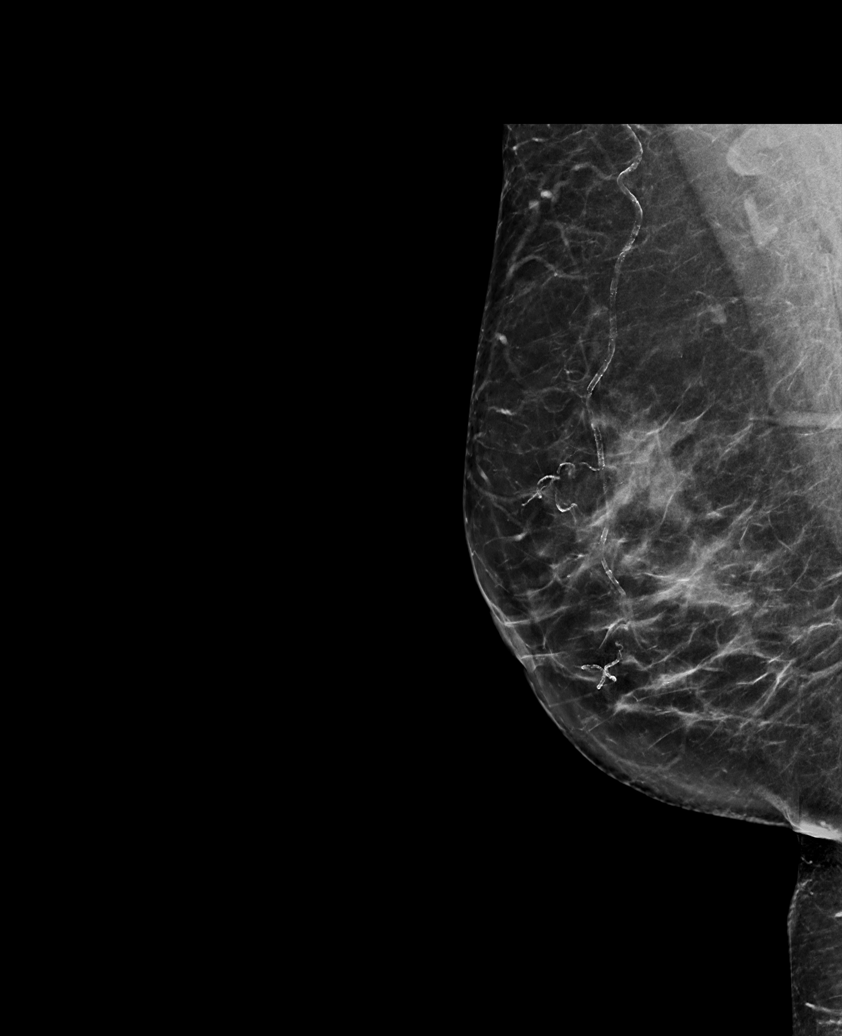

[R ML synth-2D]
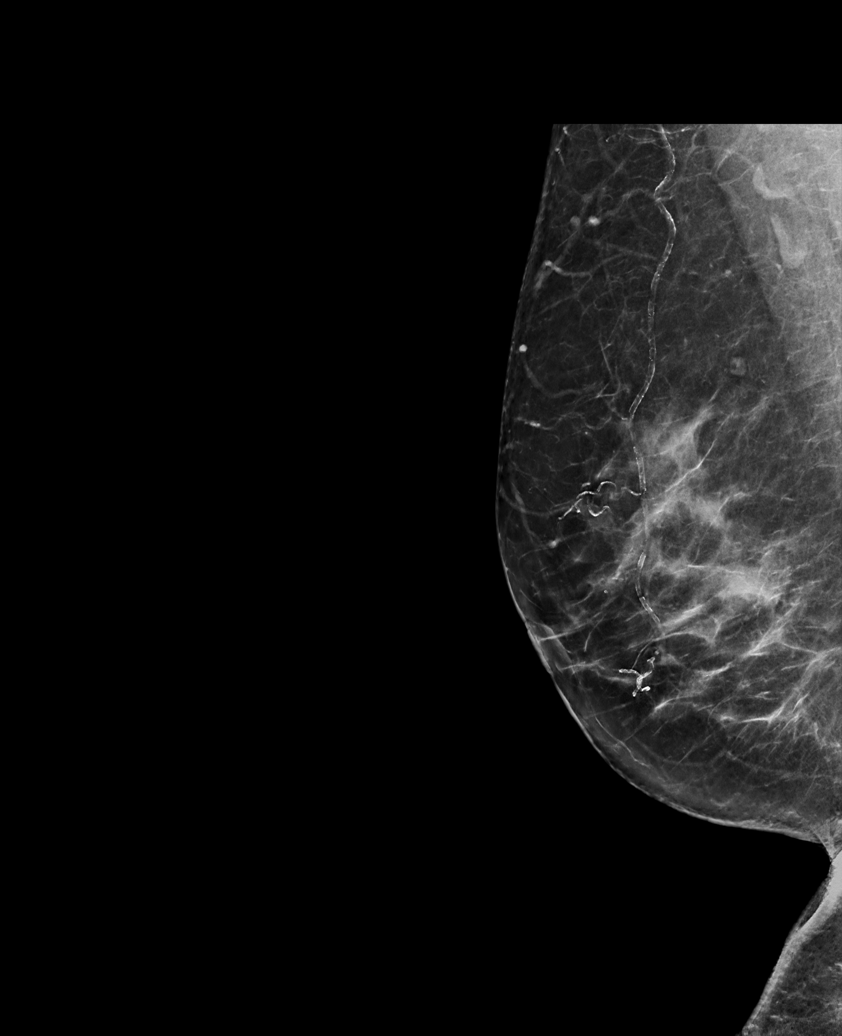

[R CC tomo · tomo slice 43/86.0]
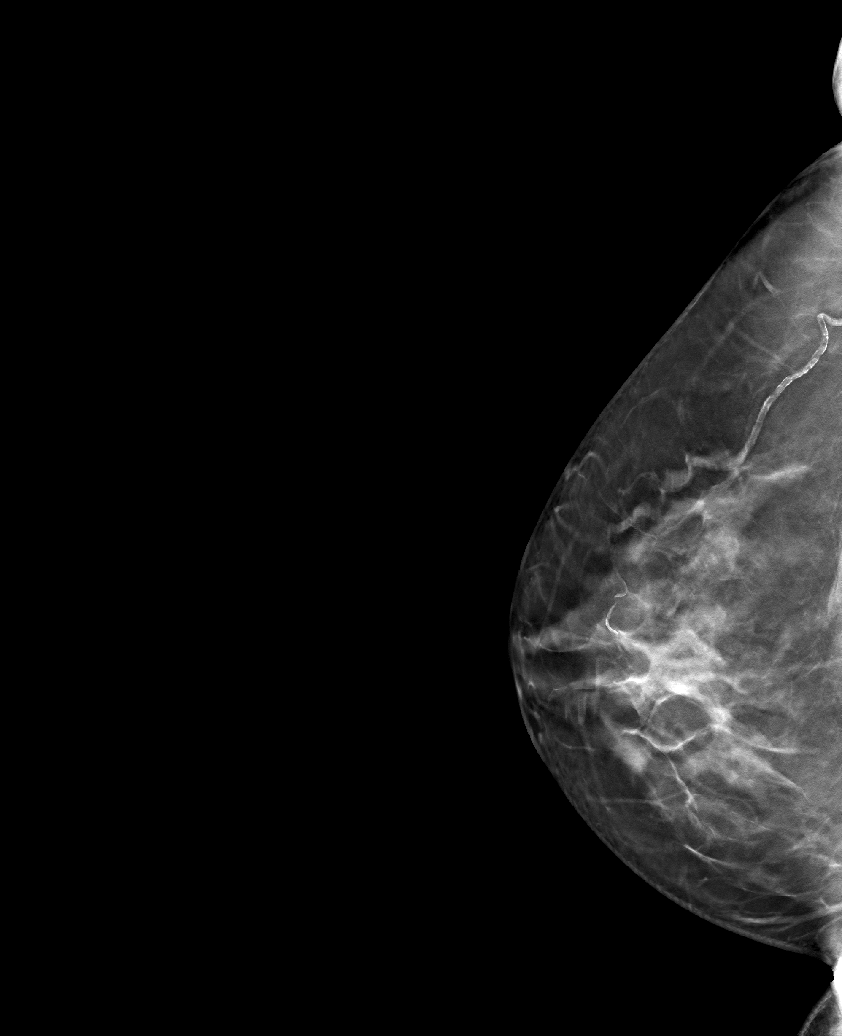

[R MLO tomo · tomo slice 39/76.0]
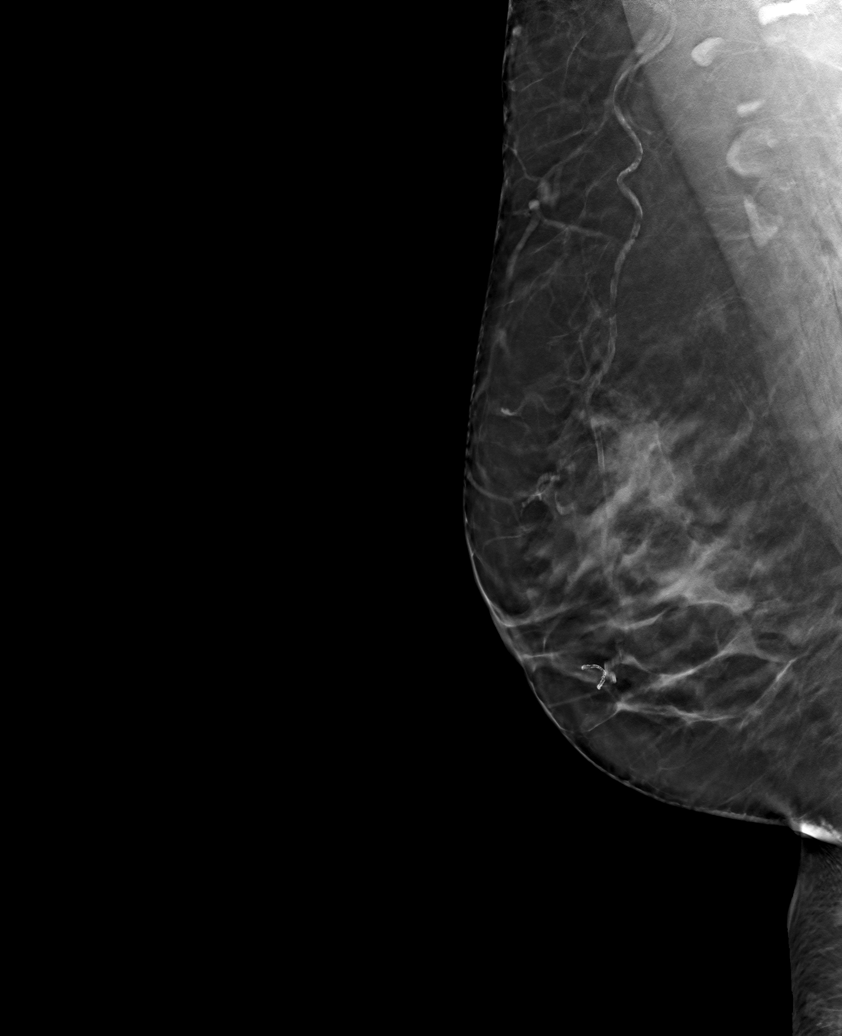

[R ML tomo · tomo slice 41/82.0]
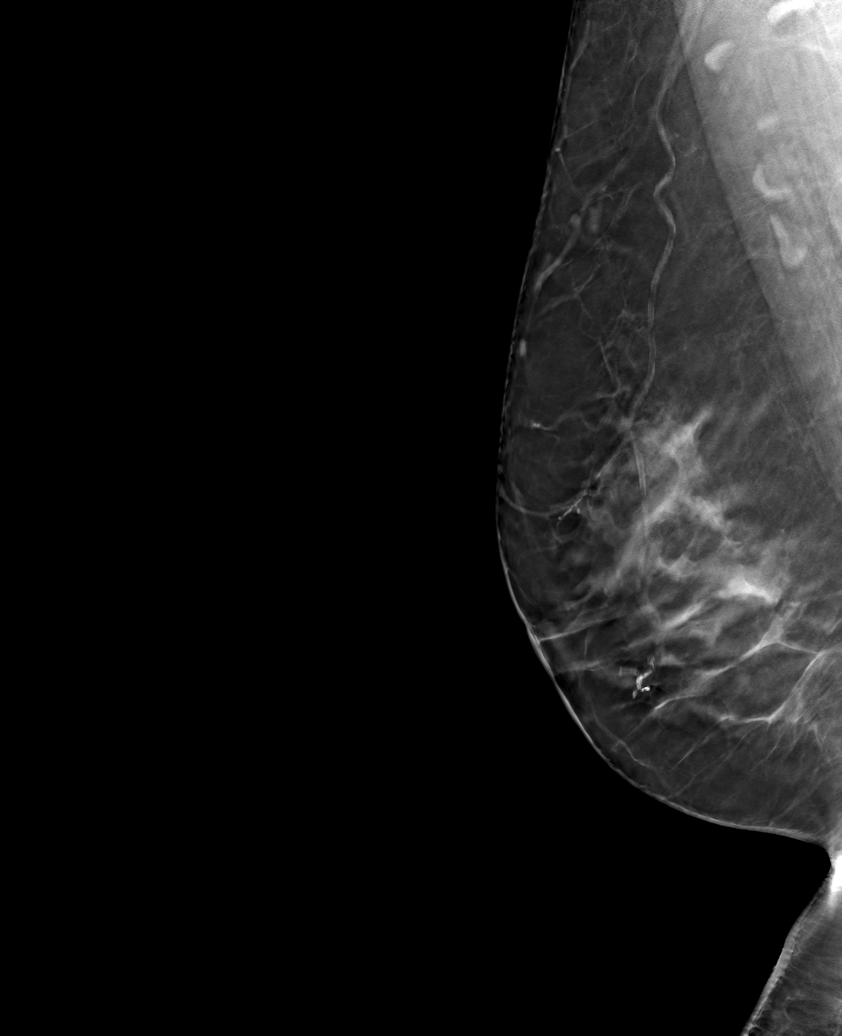

[6 of 18 positions shown; findings below may reference images not displayed]

ACR Breast Density Category c: The breast tissue is heterogeneously
dense, which may obscure small masses.
FINDINGS: Mammogram:

Full field tomosynthesis views of the right breast were performed.
No suspicious mass, distortion, or microcalcifications are
identified to suggest presence of malignancy. There is no abnormal
lymph node in the visualized right axilla.

On physical exam at the site of tenderness/possible lump in the
outer right breast I do not feel a discrete abnormality. There are
no obvious skin changes or bruising.

Ultrasound:

Targeted ultrasound is performed at the site of tenderness/possible
lump in the right breast at 9 o'clock 12 cm from the nipple
demonstrating no cystic or solid mass. No focal fluid collection.
IMPRESSION: No mammographic or sonographic evidence of malignancy or other
imaging abnormality at the site of tenderness reported by the
patient in the far outer right breast/chest wall.

RECOMMENDATION:
1. Recommend any additional workup of the right breast
tenderness/palpable site be on a clinical basis.

2.  Screening mammogram in one year.(Code:HI-[id number])

I have discussed the findings and recommendations with the patient.
If applicable, a reminder letter will be sent to the patient
regarding the next appointment.

BI-RADS CATEGORY  1: Negative.

## 2022-12-31 DIAGNOSIS — N942 Vaginismus: Secondary | ICD-10-CM | POA: Diagnosis not present

## 2022-12-31 DIAGNOSIS — R300 Dysuria: Secondary | ICD-10-CM | POA: Diagnosis not present

## 2022-12-31 DIAGNOSIS — N812 Incomplete uterovaginal prolapse: Secondary | ICD-10-CM | POA: Diagnosis not present

## 2023-01-17 DIAGNOSIS — R102 Pelvic and perineal pain: Secondary | ICD-10-CM | POA: Diagnosis not present

## 2023-01-17 DIAGNOSIS — R1030 Lower abdominal pain, unspecified: Secondary | ICD-10-CM | POA: Diagnosis not present

## 2023-01-17 DIAGNOSIS — R319 Hematuria, unspecified: Secondary | ICD-10-CM | POA: Diagnosis not present

## 2023-01-17 DIAGNOSIS — R109 Unspecified abdominal pain: Secondary | ICD-10-CM | POA: Diagnosis not present

## 2023-01-18 ENCOUNTER — Other Ambulatory Visit: Payer: Self-pay | Admitting: Obstetrics and Gynecology

## 2023-01-18 DIAGNOSIS — R109 Unspecified abdominal pain: Secondary | ICD-10-CM

## 2023-01-20 ENCOUNTER — Encounter: Payer: Self-pay | Admitting: Cardiology

## 2023-01-20 DIAGNOSIS — E782 Mixed hyperlipidemia: Secondary | ICD-10-CM | POA: Insufficient documentation

## 2023-01-20 DIAGNOSIS — R931 Abnormal findings on diagnostic imaging of heart and coronary circulation: Secondary | ICD-10-CM

## 2023-01-20 HISTORY — DX: Abnormal findings on diagnostic imaging of heart and coronary circulation: R93.1

## 2023-01-20 NOTE — Progress Notes (Unsigned)
Primary Care Provider: Thana Ates, MD [facility] Cardiologist: None Electrophysiologist: None  Clinic Note: No chief complaint on file.  ===================================  ASSESSMENT/PLAN   Problem List Items Addressed This Visit     Mixed dyslipidemia: 11/09/2022: TC 318, TG 211, HDL 61, LDL 216 (Chronic)   Agatston coronary artery calcium score less than 100 - Primary (Chronic)    ===================================  HPI:    Joyce Allison is a 62 y.o. female with PMH notable for HLD, Thyroid Disease (hypothyroid from Hashimoto's), and history of Viral Pleurisy who is being seen today for the evaluation of Coronary Calcium Score of 73, and significant HYPERLIPIDEMIA at the request of Thana Ates, MD.  Joyce Allison was referred for Coronary Calcium Score testing by her PCP revealing a Kardie Tobb score of 73, and labs showed pretty significant hyperlipidemia with LDL over 200. Recommended starting aspirin 81 mg daily and referral to cardiology.  Consider PCSK9 inhibitor.  Recent Hospitalizations: None  Reviewed  CV studies:    The following studies were reviewed today: (if available, images/films reviewed: From Epic Chart or Care Everywhere) Coronary Calcium Score 11/21/2022: CAC score 73.  (LAD 72, LCx 1)  Interval History:   Joyce Allison   CV Review of Symptoms (Summary): Cardiovascular ROS: {[id number]}  REVIEWED OF SYSTEMS   ROS  I have reviewed and (if needed) personally updated the patient's problem list, medications, allergies, past medical and surgical history, social and family history.   PAST MEDICAL HISTORY   Past Medical History:  Diagnosis Date   Arthritis    GERD (gastroesophageal reflux disease)    Hyperlipidemia    Hypothyroidism    Peripartum. ?  Related to Hashimoto's   Vitamin D deficiency   Vitamin D deficiency   PAST SURGICAL HISTORY   Past Surgical History:  Procedure Laterality Date   CHOLECYSTECTOMY  N/A 09/01/2019   Procedure: LAPAROSCOPIC CHOLECYSTECTOMY;  Surgeon: Diamantina Monks, MD;  Location: [facility] OR;  Service: General;  Laterality: N/A; LAPAROSCOPIC CHOLECYSTECTOMY   COLONOSCOPY  06/20/2020   With endoscopy   TUBAL LIGATION  1985   MEDICATIONS/ALLERGIES   No outpatient medications have been marked as taking for the 01/22/23 encounter (Appointment) with Marykay Lex, MD.   Medications per PCP: Vitamin D3 125 mcg daily; pantoprazole 40 mg daily, Multi-4-her 50+ daily, magnesium 500 mg qHS, levothyroxine 75 mcg qAM, hydrocortisone acetate 25 mg suppository PRN  Allergies  Allergen Reactions   Motrin [Ibuprofen] Anaphylaxis    SOCIAL HISTORY/FAMILY HISTORY   Reviewed in Epic:   Social History   Tobacco Use   Smoking status: Never   Smokeless tobacco: Never  Vaping Use   Vaping status: Never Used  Substance Use Topics   Alcohol use: Yes    Comment: OCCASIONAL   Drug use: Never   Social History   Social History Narrative   Not on file   Family History  Problem Relation Age of Onset   Arthritis Mother        Psoriatic arthritis    Mother had psoriatic arthritis.  OBJCTIVE -PE, EKG, labs   Wt Readings from Last 3 Encounters:  No data found for Wt    Physical Exam: There were no vitals taken for this visit. Physical Exam   Adult ECG Report     Recent Labs:   11/09/2022: TC 318, TG 211, HDL 61, LDL 216 **; AST 21, ALT 23, ALP 63.  No results found for: "CHOL", "HDL", "LDLCALC", "LDLDIRECT", "  TRIG", "CHOLHDL" Lab Results  Component Value Date   NA 139 08/31/2019   CL 104 08/31/2019   K 3.7 08/31/2019   CO2 23 08/31/2019   BUN 13 08/31/2019   CREATININE 0.77 08/31/2019   GFRNONAA >60 08/31/2019   CALCIUM 9.4 08/31/2019   ALBUMIN 4.4 08/31/2019   GLUCOSE 105 (H) 08/31/2019      Latest Ref Rng & Units 08/31/2019    4:29 PM  CBC  WBC 4.0 - 10.5 K/uL 8.4   Hemoglobin 12.0 - 15.0 g/dL 46.9   Hematocrit 62.9 - 46.0 % 42.3   Platelets 150 -  400 K/uL 291     No results found for: "HGBA1C" No results found for: "TSH"  ================================================== Current medicines are reviewed at length with the patient today.  (+/- concerns) ***  Notice: This dictation was prepared with Dragon dictation along with smart phrase technology. Any transcriptional errors that result from this process are unintentional and may not be corrected upon review.   Studies Ordered:  No orders of the defined types were placed in this encounter.  No orders of the defined types were placed in this encounter.   Patient Instructions / Medication Changes & Studies & Tests Ordered   There are no Patient Instructions on file for this visit.   {Are you ordering a CV Procedure (e.g. stress test, cath, DCCV, TEE, etc)?   Press F2        :[id number]}   Dispo: No follow-ups on file.  Total time spent: *** min spent with patient + *** min spent charting = *** min     Signed, Marykay Lex, MD, MS Bryan Lemma, M.D., M.S. Interventional Cardiologist  [facility]  Pager # [phone] Phone # [phone] [address] [place], [place] [address]   Thank you for choosing [facility] [place]!!

## 2023-01-22 ENCOUNTER — Encounter: Payer: Self-pay | Admitting: Cardiology

## 2023-01-22 ENCOUNTER — Ambulatory Visit: Payer: BC Managed Care – PPO | Attending: Cardiology | Admitting: Cardiology

## 2023-01-22 VITALS — BP 122/82 | HR 82 | Ht 62.0 in | Wt 151.4 lb

## 2023-01-22 DIAGNOSIS — K760 Fatty (change of) liver, not elsewhere classified: Secondary | ICD-10-CM | POA: Diagnosis not present

## 2023-01-22 DIAGNOSIS — T466X5A Adverse effect of antihyperlipidemic and antiarteriosclerotic drugs, initial encounter: Secondary | ICD-10-CM

## 2023-01-22 DIAGNOSIS — G720 Drug-induced myopathy: Secondary | ICD-10-CM | POA: Diagnosis not present

## 2023-01-22 DIAGNOSIS — E782 Mixed hyperlipidemia: Secondary | ICD-10-CM

## 2023-01-22 DIAGNOSIS — R931 Abnormal findings on diagnostic imaging of heart and coronary circulation: Secondary | ICD-10-CM

## 2023-01-22 DIAGNOSIS — N814 Uterovaginal prolapse, unspecified: Secondary | ICD-10-CM | POA: Diagnosis not present

## 2023-01-22 DIAGNOSIS — K5900 Constipation, unspecified: Secondary | ICD-10-CM | POA: Diagnosis not present

## 2023-01-22 NOTE — Patient Instructions (Addendum)
Medication Instructions:   No changes   *If you need a refill on your cardiac medications before your next appointment, please call your pharmacy*   Lab Work: fasting  LIPID CMP HgbA1c If you have labs (blood work) drawn today and your tests are completely normal, you will receive your results only by: MyChart Message (if you have MyChart) OR A paper copy in the mail If you have any lab test that is abnormal or we need to change your treatment, we will call you to review the results.   Testing/Procedures: Not needed   Follow-Up: At [facility], you and your health needs are our priority.  As part of our continuing mission to provide you with exceptional heart care, we have created designated Provider Care Teams.  These Care Teams include your primary Cardiologist (physician) and Advanced Practice Providers (APPs -  Physician Assistants and Nurse Practitioners) who all work together to provide you with the care you need, when you need it.     Your next appointment:   Follow up with [facility]- lipid clinic     The format for your next appointment:   In Person  Provider:   Bryan Lemma, MD   You have been referred to [facility]- lipid

## 2023-01-22 NOTE — Assessment & Plan Note (Addendum)
Elevated lipids at the level of the lumbar 2 to potential familial component to it.  Not able to tolerate statins edema in lower doses.  Will refer to lipid clinic after reassessing lipid panel along with LP(a) and chemistries.  When evidence of coronary disease/calcification on coronary calcium score, we do want her get her lipids under some control.  Thankfully, it is still not all that high so I do not necessarily for less than 70 LDL.

## 2023-01-22 NOTE — Assessment & Plan Note (Signed)
We discussed the pathophysiology of atherosclerosis plaque using pictures and models.  I talked about the role of LDL and HDL etc.  We talked about the important role of LDL as well as LP(a).  She discussed her side effects myopathy symptoms from statin.  She has tried to statins, and has pretty significantly elevated lipid panel with evidence of coronary artery calcification/indicative of CAD.  At this level, her calcium score would not necessarily indicate that she should be on aspirin, but we do need to be at least try to get her LDL down less than 100 if not closer to 70 and is currently on 216.  Thankfully, her BP is controlled and her lipids are well-controlled.  She is also not obese and is a non-smoker.

## 2023-01-22 NOTE — Assessment & Plan Note (Addendum)
Unable to tolerate either rosuvastatin or atorvastatin.  Likely will not be tolerant of any statins.  I think regardless the level of her lipids would warrant more aggressive management.  Plan: Recheck lipid panel along with LP(a) and refer to [facility] lipid clinic plus or minus Dr. Rennis Golden.  She may potentially have some type of familial hyperlipidemia.  Assist for she may benefit from PCSK9 inhibitors.  Potentially inclisiran.

## 2023-01-23 DIAGNOSIS — R0789 Other chest pain: Secondary | ICD-10-CM | POA: Diagnosis not present

## 2023-02-08 DIAGNOSIS — R931 Abnormal findings on diagnostic imaging of heart and coronary circulation: Secondary | ICD-10-CM | POA: Diagnosis not present

## 2023-02-08 DIAGNOSIS — E782 Mixed hyperlipidemia: Secondary | ICD-10-CM | POA: Diagnosis not present

## 2023-02-13 ENCOUNTER — Ambulatory Visit
Payer: BC Managed Care – PPO | Attending: Cardiovascular Disease | Admitting: Pharmacist Clinician (PhC)/ Clinical Pharmacy Specialist

## 2023-02-13 DIAGNOSIS — E782 Mixed hyperlipidemia: Secondary | ICD-10-CM

## 2023-02-13 NOTE — Patient Instructions (Signed)
Your Results:             Your most recent labs Goal  Total Cholesterol 341 < 200  Triglycerides 274 < 150  HDL (happy/good cholesterol) 58 > 40  LDL (lousy/bad cholesterol  < 70   Medication changes:  We will start the process to get Repatha covered by your insurance.  Once the prior authorization is complete, I will call/send a MyChart message to let you know and confirm pharmacy information.   You will take one injection every 14 days  Lab orders:  We want to repeat labs after 2-3 months.  We will send you a lab order to remind you once we get closer to that time.    Patient Assistance:    Go to [email or web address] and click on "paying for Repatha"  You will then click on the tab for commercial insurance  Thank you for choosing [facility]   High Triglycerides Eating Plan Triglycerides are a type of fat in the blood. High levels of triglycerides can increase your risk of heart disease and stroke. If your triglyceride levels are high, choosing the right foods can help lower your triglycerides and keep your heart healthy. Work with your health care provider or a dietitian to develop an eating plan that is right for you. What are tips for following this plan? General guidelines  Lose weight, if you are overweight. For most people, losing 5-10 lb (2-5 kg) helps lower triglyceride levels. A weight-loss plan may include: 30 minutes of exercise at least 5 days a week. Reducing the amount of calories, sugar, and fat you eat. Eat a wide variety of fresh fruits, vegetables, and whole grains. These foods are high in fiber. Eat foods that contain healthy fats, such as fatty fish, nuts, seeds, and olive oil. Avoid foods that are high in added sugar, added salt (sodium), and saturated fat. Avoid low-fiber, refined carbohydrates such as white bread, crackers, noodles, and white rice. Avoid foods with trans fats or partially hydrogenated oils, such as fried foods or stick margarine. If you drink  alcohol: Limit how much you have to: 0-1 drink a day for women who are not pregnant. 0-2 drinks a day for men. Your health care provider may recommend that you drink less than these amounts depending on your overall health. Know how much alcohol is in a drink. In the [place] one drink equals one 12 oz bottle of beer (355 mL), one 5 oz glass of wine (148 mL), or one 1 oz glass of hard liquor (44 mL). Reading food labels Check food labels for: The amount of saturated fat. Choose foods with no or very little saturated fat (less than 2 g). The amount of trans fat. Choose foods with no transfat. The amount of cholesterol. Choose foods that are low in cholesterol. The amount of sodium. Choose foods with less than 140 milligrams (mg) per serving. Shopping Buy dairy products labeled as nonfat (skim) or low-fat (1%). Avoid buying processed or prepackaged foods. These are often high in added sugar, sodium, and fat. Cooking Choose healthy fats when cooking, such as olive oil, avocado oil, or canola oil. Cook foods using lower fat methods, such as baking, broiling, boiling, or grilling. Make your own sauces, dressings, and marinades when possible, instead of buying them. Store-bought sauces, dressings, and marinades are often high in sodium and sugar. Meal planning Eat more home-cooked food and less restaurant, buffet, and fast food. Eat fatty fish at least 2 times each week. Examples  of fatty fish include salmon, trout, sardines, mackerel, tuna, and herring. If you eat whole eggs, do not eat more than 4 egg yolks per week. What foods should I eat? Fruits All fresh, canned (in natural juice), or frozen fruits. Vegetables Fresh or frozen vegetables. Low-sodium canned vegetables. Grains Whole wheat or whole grain breads, crackers, cereals, and pasta. Unsweetened oatmeal. Bulgur. Barley. Quinoa. Brown rice. Whole wheat flour tortillas. Meats and other proteins Skinless chicken or [place]. Ground  chicken or [place]. Lean cuts of pork, trimmed of fat. Fish and seafood, especially salmon, trout, and herring. Egg whites. Dried beans, peas, or lentils. Unsalted nuts or seeds. Unsalted canned beans. Natural peanut or almond butter or other nut butters. Dairy Low-fat dairy products. Skim or low-fat (1%) milk. Reduced fat (2%) and low-sodium cheese. Low-fat ricotta cheese. Low-fat cottage cheese. Plain, low-fat yogurt. Fats and oils Tub margarine without trans fats. Light or reduced-fat mayonnaise. Light or reduced-fat salad dressings. Avocado. Safflower, olive, sunflower, soybean, and canola oils. The items listed above may not be a complete list of recommended foods and beverages. Talk with your dietitian about what dietary choices are best for you. What foods should I avoid? Fruits Sweetened dried fruit. Canned fruit in syrup. Fruit juice. Vegetables Creamed or fried vegetables. Vegetables in a cheese sauce. Grains White bread. White (regular) pasta. White rice. Cornbread. Bagels. Pastries. Crackers that contain trans fat. Meats and other proteins Fatty cuts of meat. Ribs. Chicken wings. Tomasa Blase. Sausage. Bologna. Salami. Chitterlings. Fatback. Hot dogs. Bratwurst. Packaged lunch meats. Dairy Whole or reduced-fat (2%) milk. Half-and-half. Cream cheese. Full-fat or sweetened yogurt. Full-fat cheese. Nondairy creamers. Whipped toppings. Processed cheese or cheese spreads. Cheese curds. Fats and oils Butter. Stick margarine. Lard. Shortening. Ghee. Bacon fat. Tropical oils, such as coconut, palm kernel, or palm oils. Beverages Alcohol. Sweetened drinks, such as soda, lemonade, fruit drinks, or punches. Sweets and desserts Corn syrup. Sugars. Honey. Molasses. Candy. Jam and jelly. Syrup. Sweetened cereals. Cookies. Pies. Cakes. Donuts. Muffins. Ice cream. Condiments Store-bought sauces, dressings, and marinades that are high in sugar, such as ketchup and barbecue sauce. The items listed above  may not be a complete list of foods and beverages you should avoid. Talk with your dietitian about what dietary choices are best for you. Summary High levels of triglycerides can increase the risk of heart disease and stroke. Choosing the right foods can help lower your triglycerides. Eat plenty of fresh fruits, vegetables, and whole grains. Choose low-fat dairy and lean meats. Eat fatty fish at least twice a week. Avoid processed and prepackaged foods with added sugar, sodium, saturated fat, and trans fat. If you need suggestions or have questions about what types of food are good for you, talk with your health care provider or a dietitian. This information is not intended to replace advice given to you by your health care provider. Make sure you discuss any questions you have with your health care provider. Document Revised: 10/28/2020 Document Reviewed: 10/28/2020 Elsevier Patient Education  2024 [employer].

## 2023-02-13 NOTE — Progress Notes (Unsigned)
   Office Visit    Patient Name: Joyce Allison Date of Encounter: 02/14/2023  Primary Care Provider:  Thana Ates, MD Primary Cardiologist:  Bryan Lemma, MD  Chief Complaint    Hyperlipidemia - familial  Significant Past Medical History   CAD CAC 73.4 (81st percentile)  hypothyroidism On levothyroxine 75 mcg     Allergies  Allergen Reactions   Motrin [Ibuprofen] Anaphylaxis    History of Present Illness    Joyce Allison is a 62 y.o. female patient of Dr Herbie Baltimore, in the office today to discuss options for cholesterol management.  Insurance Carrier:  [employer]  LDL Cholesterol goal:  LDL < 70  Current Medications:   none  Previously tried:  atorvastatin, rosuvastatin - myalgias, upset stomach, vivid dreams  Family Hx:  father had MI in his 45's, valve replacement, died recently at 72; mother w/o heart disease; brother had heart murmur; 3 children, healthy  Social Hx: Tobacco: no Alcohol: occasional   Diet:   mostly home cooked meals; variety of proteins, vegetables usually fresh and frozen; likes dairy and cheese; snacks are usually crackers or tortilla chips  Exercise: has chicken/rabbits, garden on weekends  Accessory Clinical Findings   Lab Results  Component Value Date   CHOL 341 (H) 02/08/2023   HDL 58 02/08/2023   LDLCALC 227 (H) 02/08/2023   TRIG 274 (H) 02/08/2023   CHOLHDL 5.9 (H) 02/08/2023    No results found for: "LIPOA"  Lab Results  Component Value Date   ALT 29 02/08/2023   AST 26 02/08/2023   ALKPHOS 77 02/08/2023   BILITOT 0.3 02/08/2023   Lab Results  Component Value Date   CREATININE 0.68 02/08/2023   BUN 9 02/08/2023   NA 137 02/08/2023   K 4.0 02/08/2023   CL 100 02/08/2023   CO2 23 02/08/2023   Lab Results  Component Value Date   HGBA1C 5.5 02/08/2023    Home Medications    Current Outpatient Medications  Medication Sig Dispense Refill   cholecalciferol (VITAMIN D3) 25 MCG (1000 UNIT) tablet Take 1,000  Units by mouth daily.     levothyroxine (SYNTHROID) 75 MCG tablet Take 75 mcg by mouth every morning.     Multiple Vitamins-Minerals (CENTRUM ADULT PO) Take 1 tablet by mouth daily.     No current facility-administered medications for this visit.     Assessment & Plan    Mixed dyslipidemia: 11/09/2022: TC 318, TG 211, HDL 61, LDL 216 Assessment: Patient with familial hyperlipidemia not at LDL goal of < 70 Most recent LDL 227 on 02/08/2023 Not able to tolerate statins secondary to myalgias - atorvastatin, rosuvastatin Reviewed options for lowering LDL cholesterol, including ezetimibe, PCSK-9 inhibitors, bempedoic acid and inclisiran.  Discussed mechanisms of action, dosing, side effects, potential decreases in LDL cholesterol and costs.  Also reviewed potential options for patient assistance.  Plan: Patient agreeable to starting Repatha 140 mg q14d Repeat labs after:  3 months Lipid Liver function Patient was given information for copay card from manufacturer   Phillips Hay, PharmD CPP [facility] [address]  [place], [place] [address] [phone]  02/14/2023, 11:11 AM

## 2023-02-14 ENCOUNTER — Telehealth: Payer: Self-pay | Admitting: Pharmacist Clinician (PhC)/ Clinical Pharmacy Specialist

## 2023-02-14 ENCOUNTER — Encounter: Payer: Self-pay | Admitting: Pharmacist Clinician (PhC)/ Clinical Pharmacy Specialist

## 2023-02-14 ENCOUNTER — Other Ambulatory Visit (HOSPITAL_COMMUNITY): Payer: Self-pay

## 2023-02-14 ENCOUNTER — Telehealth: Payer: Self-pay

## 2023-02-14 NOTE — Telephone Encounter (Signed)
PA request has been Submitted. New Encounter created for follow up. For additional info see Pharmacy Prior Auth telephone encounter from 02/14/23.

## 2023-02-14 NOTE — Telephone Encounter (Signed)
Pharmacy Patient Advocate Encounter   Received notification from Physician's Office that prior authorization for REPATHA is required/requested.   Insurance verification completed.   The patient is insured through [facility] .   Per test claim: PA required; PA submitted to [facility] via CoverMyMeds Key/confirmation #/[id number] [facility] Status is pending

## 2023-02-14 NOTE — Assessment & Plan Note (Signed)
Assessment: Patient with familial hyperlipidemia not at LDL goal of < 70 Most recent LDL 227 on 02/08/2023 Not able to tolerate statins secondary to myalgias - atorvastatin, rosuvastatin Reviewed options for lowering LDL cholesterol, including ezetimibe, PCSK-9 inhibitors, bempedoic acid and inclisiran.  Discussed mechanisms of action, dosing, side effects, potential decreases in LDL cholesterol and costs.  Also reviewed potential options for patient assistance.  Plan: Patient agreeable to starting Repatha 140 mg q14d Repeat labs after:  3 months Lipid Liver function Patient was given information for copay card from manufacturer

## 2023-02-14 NOTE — Telephone Encounter (Signed)
Please do PA for Repatha 140 mg 

## 2023-02-15 ENCOUNTER — Other Ambulatory Visit (HOSPITAL_COMMUNITY): Payer: Self-pay

## 2023-02-15 NOTE — Telephone Encounter (Signed)
Faxing additional requested info to plan.

## 2023-02-18 ENCOUNTER — Other Ambulatory Visit (HOSPITAL_COMMUNITY): Payer: Self-pay

## 2023-02-18 MED ORDER — REPATHA SURECLICK 140 MG/ML ~~LOC~~ SOAJ: 140.0000 mg | SUBCUTANEOUS | 3 refills | Status: DC

## 2023-02-18 NOTE — Telephone Encounter (Signed)
Pharmacy Patient Advocate Encounter  Received notification from [facility] that Prior Authorization for REPATHA has been APPROVED from 02/14/23 to 08/17/23. Ran test claim, Copay is $0. This test claim was processed through [facility]- copay amounts may vary at other pharmacies due to pharmacy/plan contracts, or as the patient moves through the different stages of their insurance plan.

## 2023-02-18 NOTE — Addendum Note (Signed)
Addended by: Rosalee Kaufman on: 02/18/2023 03:51 PM   Modules accepted: Orders

## 2023-02-21 DIAGNOSIS — K644 Residual hemorrhoidal skin tags: Secondary | ICD-10-CM | POA: Diagnosis not present

## 2023-02-21 DIAGNOSIS — K5730 Diverticulosis of large intestine without perforation or abscess without bleeding: Secondary | ICD-10-CM | POA: Diagnosis not present

## 2023-02-21 DIAGNOSIS — K635 Polyp of colon: Secondary | ICD-10-CM | POA: Diagnosis not present

## 2023-02-21 DIAGNOSIS — K648 Other hemorrhoids: Secondary | ICD-10-CM | POA: Diagnosis not present

## 2023-02-21 DIAGNOSIS — Z86010 Personal history of colonic polyps: Secondary | ICD-10-CM | POA: Diagnosis not present

## 2023-02-22 ENCOUNTER — Other Ambulatory Visit: Payer: Self-pay | Admitting: Oncology

## 2023-02-22 DIAGNOSIS — Z006 Encounter for examination for normal comparison and control in clinical research program: Secondary | ICD-10-CM

## 2023-05-01 ENCOUNTER — Other Ambulatory Visit (HOSPITAL_COMMUNITY): Payer: BC Managed Care – PPO

## 2023-05-22 ENCOUNTER — Telehealth: Payer: Self-pay | Admitting: Pharmacist Clinician (PhC)/ Clinical Pharmacy Specialist

## 2023-05-22 DIAGNOSIS — E782 Mixed hyperlipidemia: Secondary | ICD-10-CM

## 2023-05-22 NOTE — Telephone Encounter (Signed)
Lipid labs ordered  

## 2023-05-24 ENCOUNTER — Other Ambulatory Visit (HOSPITAL_COMMUNITY)
Admission: RE | Admit: 2023-05-24 | Discharge: 2023-05-24 | Disposition: A | Discharge status: Home / Self Care | Payer: BC Managed Care – PPO | Source: Ambulatory Visit | Attending: Oncology | Admitting: Oncology

## 2023-05-24 DIAGNOSIS — Z006 Encounter for examination for normal comparison and control in clinical research program: Secondary | ICD-10-CM | POA: Insufficient documentation

## 2023-06-04 LAB — GENECONNECT MOLECULAR SCREEN: Genetic Analysis Overall Interpretation: NEGATIVE

## 2023-10-01 ENCOUNTER — Other Ambulatory Visit: Payer: Self-pay

## 2023-10-01 DIAGNOSIS — E782 Mixed hyperlipidemia: Secondary | ICD-10-CM

## 2023-10-01 LAB — LIPID PANEL
Chol/HDL Ratio: 3.3 ratio (ref 0.0–4.4)
Cholesterol, Total: 209 mg/dL — ABNORMAL HIGH (ref 100–199)
HDL: 64 mg/dL (ref >39)
LDL Chol Calc (NIH): 120 mg/dL — ABNORMAL HIGH (ref 0–99)
Triglycerides: 142 mg/dL (ref 0–149)
VLDL Cholesterol Cal: 25 mg/dL (ref 5–40)

## 2023-10-01 LAB — HEPATIC FUNCTION PANEL
ALT: 26 IU/L (ref 0–32)
AST: 26 IU/L (ref 0–40)
Albumin: 4.4 g/dL (ref 3.9–4.9)
Alkaline Phosphatase: 76 IU/L (ref 44–121)
Bilirubin Total: 0.3 mg/dL (ref 0.0–1.2)
Bilirubin, Direct: 0.1 mg/dL (ref 0.00–0.40)
Total Protein: 6.8 g/dL (ref 6.0–8.5)

## 2023-10-03 ENCOUNTER — Encounter: Payer: Self-pay | Admitting: Pharmacist Clinician (PhC)/ Clinical Pharmacy Specialist

## 2023-10-11 ENCOUNTER — Encounter (HOSPITAL_COMMUNITY): Payer: Self-pay | Admitting: *Deleted

## 2023-10-11 ENCOUNTER — Emergency Department (HOSPITAL_COMMUNITY)
Admission: EM | Admit: 2023-10-11 | Discharge: 2023-10-11 | Disposition: A | Discharge status: Home / Self Care | Attending: Emergency Medicine | Admitting: Emergency Medicine

## 2023-10-11 ENCOUNTER — Emergency Department (HOSPITAL_COMMUNITY)

## 2023-10-11 ENCOUNTER — Other Ambulatory Visit: Payer: Self-pay

## 2023-10-11 DIAGNOSIS — M7918 Myalgia, other site: Secondary | ICD-10-CM

## 2023-10-11 DIAGNOSIS — R0789 Other chest pain: Secondary | ICD-10-CM | POA: Diagnosis not present

## 2023-10-11 DIAGNOSIS — M546 Pain in thoracic spine: Secondary | ICD-10-CM | POA: Insufficient documentation

## 2023-10-11 DIAGNOSIS — R079 Chest pain, unspecified: Secondary | ICD-10-CM | POA: Diagnosis not present

## 2023-10-11 DIAGNOSIS — M791 Myalgia, unspecified site: Secondary | ICD-10-CM | POA: Diagnosis not present

## 2023-10-11 LAB — BASIC METABOLIC PANEL WITH GFR
Anion gap: 11 (ref 5–15)
BUN: 8 mg/dL (ref 8–23)
CO2: 23 mmol/L (ref 22–32)
Calcium: 9.2 mg/dL (ref 8.9–10.3)
Chloride: 105 mmol/L (ref 98–111)
Creatinine, Ser: 0.61 mg/dL (ref 0.44–1.00)
GFR, Estimated: >60 mL/min
Glucose, Bld: 90 mg/dL (ref 70–99)
Potassium: 3.7 mmol/L (ref 3.5–5.1)
Sodium: 139 mmol/L (ref 135–145)

## 2023-10-11 LAB — CBC
HCT: 44.2 % (ref 36.0–46.0)
Hemoglobin: 14.7 g/dL (ref 12.0–15.0)
MCH: 29.7 pg (ref 26.0–34.0)
MCHC: 33.3 g/dL (ref 30.0–36.0)
MCV: 89.3 fL (ref 80.0–100.0)
Platelets: 255 10*3/uL (ref 150–400)
RBC: 4.95 MIL/uL (ref 3.87–5.11)
RDW: 12.6 % (ref 11.5–15.5)
WBC: 6.2 10*3/uL (ref 4.0–10.5)
nRBC: 0 % (ref 0.0–0.2)

## 2023-10-11 LAB — TROPONIN I (HIGH SENSITIVITY): Troponin I (High Sensitivity): <2 ng/L (ref <18)

## 2023-10-11 MED ORDER — METHOCARBAMOL 500 MG PO TABS: 500.0000 mg | ORAL_TABLET | Freq: Two times a day (BID) | ORAL | 0 refills | Status: AC

## 2023-10-11 MED ORDER — LIDOCAINE 5 % EX PTCH
1.0000 | MEDICATED_PATCH | CUTANEOUS | Status: DC
  Administered 2023-10-11: 1 via TRANSDERMAL
  Filled 2023-10-11: qty 1

## 2023-10-11 MED ORDER — DIAZEPAM 5 MG PO TABS
5.0000 mg | ORAL_TABLET | Freq: Once | ORAL | Status: AC
  Administered 2023-10-11: 5 mg via ORAL
  Filled 2023-10-11: qty 1

## 2023-10-11 NOTE — ED Provider Notes (Signed)
 [place] EMERGENCY DEPARTMENT AT [facility] Provider Note   CSN: [id number] Arrival date & time: 10/11/23  1218     History  Chief Complaint  Patient presents with   Back Pain   Chest Pain    Joyce Allison is a 63 y.o. female.  63 year old female presents with right-sided back pain radiating to her right anterior chest.  Symptoms been present since yesterday.  They are atraumatic.  They have not been associate with dyspnea or diaphoresis.  No rashes appreciated.  When she twists her body to her left she feels that it works.  Better with remaining still.  Patient using Tylenol with some slight relief.  Denies any leg pain or swelling.  Denies any urinary symptoms.  No prior history of same       Home Medications Prior to Admission medications   Medication Sig Start Date End Date Taking? Authorizing Provider  cholecalciferol (VITAMIN D3) 25 MCG (1000 UNIT) tablet Take 1,000 Units by mouth daily.    [provider]  Evolocumab (REPATHA SURECLICK) 140 MG/ML SOAJ Inject 140 mg into the skin every 14 (fourteen) days. 02/18/23   Marykay Lex, MD  levothyroxine (SYNTHROID) 75 MCG tablet Take 75 mcg by mouth every morning. 06/06/19   [provider]  Multiple Vitamins-Minerals (CENTRUM ADULT PO) Take 1 tablet by mouth daily.    [provider]      Allergies    Motrin [ibuprofen]    Review of Systems   Review of Systems  All other systems reviewed and are negative.   Physical Exam Updated Vital Signs BP (!) 174/93 (BP Location: Right Arm)   Pulse 74   Temp 98.2 F (36.8 C) (Oral)   Resp 18   Ht 1.575 m (5\' 2" )   Wt 65.8 kg   SpO2 98%   BMI 26.52 kg/m  Physical Exam Vitals and nursing note reviewed.  Constitutional:      General: She is not in acute distress.    Appearance: Normal appearance. She is well-developed. She is not toxic-appearing.  HENT:     Head: Normocephalic and atraumatic.  Eyes:     General: Lids are  normal.     Conjunctiva/sclera: Conjunctivae normal.     Pupils: Pupils are equal, round, and reactive to light.  Neck:     Thyroid: No thyroid mass.     Trachea: No tracheal deviation.  Cardiovascular:     Rate and Rhythm: Normal rate and regular rhythm.     Heart sounds: Normal heart sounds. No murmur heard.    No gallop.  Pulmonary:     Effort: Pulmonary effort is normal. No respiratory distress.     Breath sounds: Normal breath sounds. No stridor. No decreased breath sounds, wheezing, rhonchi or rales.  Abdominal:     General: There is no distension.     Palpations: Abdomen is soft.     Tenderness: There is no abdominal tenderness. There is no rebound.  Musculoskeletal:        General: No tenderness. Normal range of motion.     Cervical back: Normal range of motion and neck supple.       Back:  Skin:    General: Skin is warm and dry.     Findings: No abrasion or rash.  Neurological:     Mental Status: She is alert and oriented to person, place, and time. Mental status is at baseline.     GCS: GCS eye subscore is  4. GCS verbal subscore is 5. GCS motor subscore is 6.     Cranial Nerves: No cranial nerve deficit.     Sensory: No sensory deficit.     Motor: Motor function is intact.  Psychiatric:        Attention and Perception: Attention normal.        Speech: Speech normal.        Behavior: Behavior normal.     ED Results / Procedures / Treatments   Labs (all labs ordered are listed, but only abnormal results are displayed) Labs Reviewed  BASIC METABOLIC PANEL WITH GFR  CBC  TROPONIN I (HIGH SENSITIVITY)    EKG EKG Interpretation Date/Time:  Friday October 11 2023 12:27:56 EDT Ventricular Rate:  71 PR Interval:  160 QRS Duration:  87 QT Interval:  405 QTC Calculation: 441 R Axis:   77  Text Interpretation: Sinus rhythm No significant change since last tracing Confirmed by Lorre Nick ([id number]) on 10/11/2023 1:34:34 PM  Radiology No results  found.  Procedures Procedures    Medications Ordered in ED Medications  diazepam (VALIUM) tablet 5 mg (has no administration in time range)    ED Course/ Medical Decision Making/ A&P                                 Medical Decision Making Amount and/or Complexity of Data Reviewed Labs: ordered. Radiology: ordered.  Risk Prescription drug management.   Patient an EKG which shows normal sinus rhythm.  Patient is given Valium and feels better.  Labs are reassuring.  Low suspicion for ACS.  Troponin negative.  Heart score less than 3.  Pain is reproducible along her right anterior chest wall.  Suspect musculoskeletal etiology.        Final Clinical Impression(s) / ED Diagnoses Final diagnoses:  None    Rx / DC Orders ED Discharge Orders     None         Lorre Nick, MD 10/11/23 [id number]

## 2023-10-11 NOTE — ED Triage Notes (Signed)
 Yesterday developed upper back pain with rt chest pain with no relief with Tylenol x 3. Nausea no vomiting.

## 2024-01-18 ENCOUNTER — Other Ambulatory Visit: Payer: Self-pay | Admitting: Cardiology

## 2024-03-17 DIAGNOSIS — Z1231 Encounter for screening mammogram for malignant neoplasm of breast: Secondary | ICD-10-CM | POA: Diagnosis not present

## 2024-03-17 DIAGNOSIS — Z130 Encounter for screening for diseases of the blood and blood-forming organs and certain disorders involving the immune mechanism: Secondary | ICD-10-CM | POA: Diagnosis not present

## 2024-03-17 DIAGNOSIS — Z13228 Encounter for screening for other metabolic disorders: Secondary | ICD-10-CM | POA: Diagnosis not present

## 2024-03-17 DIAGNOSIS — Z6828 Body mass index (BMI) 28.0-28.9, adult: Secondary | ICD-10-CM | POA: Diagnosis not present

## 2024-03-17 DIAGNOSIS — Z131 Encounter for screening for diabetes mellitus: Secondary | ICD-10-CM | POA: Diagnosis not present

## 2024-03-17 DIAGNOSIS — Z13820 Encounter for screening for osteoporosis: Secondary | ICD-10-CM | POA: Diagnosis not present

## 2024-03-17 DIAGNOSIS — Z13220 Encounter for screening for lipoid disorders: Secondary | ICD-10-CM | POA: Diagnosis not present

## 2024-03-17 DIAGNOSIS — Z01419 Encounter for gynecological examination (general) (routine) without abnormal findings: Secondary | ICD-10-CM | POA: Diagnosis not present

## 2024-04-14 ENCOUNTER — Other Ambulatory Visit: Payer: Self-pay | Admitting: Cardiology

## 2024-04-17 ENCOUNTER — Telehealth: Payer: Self-pay | Admitting: Cardiology

## 2024-04-17 MED ORDER — REPATHA SURECLICK 140 MG/ML ~~LOC~~ SOAJ: 140.0000 mg | SUBCUTANEOUS | 0 refills | Status: DC

## 2024-04-17 NOTE — Telephone Encounter (Signed)
 Refill sent.

## 2024-04-17 NOTE — Telephone Encounter (Signed)
*  STAT* If patient is at the pharmacy, call can be transferred to refill team.   1. Which medications need to be refilled? (please list name of each medication and dose if known)   Evolocumab  (REPATHA  SURECLICK) 140 MG/ML SOAJ     2. Would you like to learn more about the convenience, safety, & potential cost savings by using the [facility]? No    3. Are you open to using the [facility] (Type [facility]. No    4. Which pharmacy/location (including street and city if local pharmacy) is medication to be sent to? [id number] - [place], [place] - [address]     5. Do they need a 30 day or 90 day supply? 30 day    Pt has appt scheduled 12/12

## 2024-06-12 ENCOUNTER — Ambulatory Visit: Admitting: Cardiology

## 2024-07-12 ENCOUNTER — Other Ambulatory Visit: Payer: Self-pay | Admitting: Cardiology

## 2024-07-31 ENCOUNTER — Ambulatory Visit: Admitting: Cardiology

## 2024-08-19 ENCOUNTER — Ambulatory Visit: Admitting: Cardiology
